# Patient Record
Sex: Male | Born: 1955 | Race: White | Hispanic: No | Marital: Married | Smoking: Never smoker
Health system: Southern US, Community
[De-identification: ages and names within clinical notes are randomized; demographics above are authoritative.]

## PROBLEM LIST (undated history)

## (undated) DIAGNOSIS — R569 Unspecified convulsions: Secondary | ICD-10-CM

## (undated) DIAGNOSIS — I2510 Atherosclerotic heart disease of native coronary artery without angina pectoris: Secondary | ICD-10-CM

## (undated) DIAGNOSIS — Z87898 Personal history of other specified conditions: Secondary | ICD-10-CM

## (undated) DIAGNOSIS — A239 Brucellosis, unspecified: Secondary | ICD-10-CM

## (undated) HISTORY — DX: Atherosclerotic heart disease of native coronary artery without angina pectoris: I25.10

## (undated) HISTORY — DX: Brucellosis, unspecified: A23.9

## (undated) HISTORY — PX: LUMBAR DISC SURGERY: SHX700

## (undated) HISTORY — DX: Personal history of other specified conditions: Z87.898

## (undated) HISTORY — DX: Unspecified convulsions: R56.9

---

## 1998-01-29 ENCOUNTER — Ambulatory Visit (HOSPITAL_COMMUNITY): Admission: RE | Admit: 1998-01-29 | Discharge: 1998-01-29 | Payer: Self-pay | Admitting: Neurosurgery

## 2001-07-28 ENCOUNTER — Observation Stay (HOSPITAL_COMMUNITY): Admission: EM | Admit: 2001-07-28 | Discharge: 2001-07-30 | Payer: Self-pay

## 2001-07-30 ENCOUNTER — Encounter: Payer: Self-pay | Admitting: *Deleted

## 2005-07-26 ENCOUNTER — Ambulatory Visit: Payer: Self-pay | Admitting: Internal Medicine

## 2005-07-31 ENCOUNTER — Encounter: Admission: RE | Admit: 2005-07-31 | Discharge: 2005-07-31 | Payer: Self-pay | Admitting: Internal Medicine

## 2007-06-12 ENCOUNTER — Encounter: Payer: Self-pay | Admitting: Internal Medicine

## 2007-06-12 ENCOUNTER — Ambulatory Visit: Payer: Self-pay | Admitting: Internal Medicine

## 2007-06-12 DIAGNOSIS — R0789 Other chest pain: Secondary | ICD-10-CM | POA: Insufficient documentation

## 2008-11-08 ENCOUNTER — Telehealth (INDEPENDENT_AMBULATORY_CARE_PROVIDER_SITE_OTHER): Payer: Self-pay | Admitting: *Deleted

## 2008-11-09 ENCOUNTER — Ambulatory Visit: Payer: Self-pay | Admitting: Family Medicine

## 2008-11-09 DIAGNOSIS — R3129 Other microscopic hematuria: Secondary | ICD-10-CM | POA: Insufficient documentation

## 2008-11-09 LAB — CONVERTED CEMR LAB
Bilirubin Urine: NEGATIVE
Ketones, urine, test strip: NEGATIVE
Protein, U semiquant: NEGATIVE
Specific Gravity, Urine: 1.025
Urobilinogen, UA: 0.2

## 2008-11-11 ENCOUNTER — Telehealth: Payer: Self-pay | Admitting: Internal Medicine

## 2008-11-12 ENCOUNTER — Ambulatory Visit: Payer: Self-pay | Admitting: Internal Medicine

## 2008-11-12 DIAGNOSIS — N410 Acute prostatitis: Secondary | ICD-10-CM | POA: Insufficient documentation

## 2008-11-14 ENCOUNTER — Ambulatory Visit: Payer: Self-pay | Admitting: Internal Medicine

## 2008-11-14 LAB — CONVERTED CEMR LAB
ALT: 27 units/L (ref 0–53)
AST: 19 units/L (ref 0–37)
Albumin: 4.2 g/dL (ref 3.5–5.2)
BUN: 20 mg/dL (ref 6–23)
Basophils Relative: 0.7 % (ref 0.0–3.0)
CO2: 32 meq/L (ref 19–32)
Chloride: 104 meq/L (ref 96–112)
Creatinine, Ser: 0.9 mg/dL (ref 0.4–1.5)
Direct LDL: 148 mg/dL
Eosinophils Relative: 3.7 % (ref 0.0–5.0)
HDL: 32.9 mg/dL — ABNORMAL LOW (ref >39.0)
Lymphocytes Relative: 33.2 % (ref 12.0–46.0)
Monocytes Relative: 5.7 % (ref 3.0–12.0)
Neutrophils Relative %: 56.7 % (ref 43.0–77.0)
RBC: 4.96 M/uL (ref 4.22–5.81)
VLDL: 28 mg/dL (ref 0–40)
WBC: 8.5 10*3/uL (ref 4.5–10.5)

## 2008-11-23 ENCOUNTER — Encounter: Payer: Self-pay | Admitting: Internal Medicine

## 2009-01-15 ENCOUNTER — Encounter: Payer: Self-pay | Admitting: Internal Medicine

## 2009-01-20 ENCOUNTER — Telehealth (INDEPENDENT_AMBULATORY_CARE_PROVIDER_SITE_OTHER): Payer: Self-pay | Admitting: *Deleted

## 2009-01-22 ENCOUNTER — Ambulatory Visit: Payer: Self-pay | Admitting: Internal Medicine

## 2009-01-22 LAB — CONVERTED CEMR LAB
Hemoglobin, Urine: NEGATIVE
Ketones, ur: NEGATIVE mg/dL
Specific Gravity, Urine: 1.01 (ref 1.000–1.030)
Total Protein, Urine: NEGATIVE mg/dL
Urine Glucose: NEGATIVE mg/dL
Urobilinogen, UA: 0.2 (ref 0.0–1.0)

## 2009-07-30 ENCOUNTER — Encounter: Payer: Self-pay | Admitting: Internal Medicine

## 2009-08-05 ENCOUNTER — Ambulatory Visit (HOSPITAL_COMMUNITY): Admission: RE | Admit: 2009-08-05 | Discharge: 2009-08-06 | Payer: Self-pay | Admitting: Neurosurgery

## 2009-09-01 ENCOUNTER — Encounter: Payer: Self-pay | Admitting: Internal Medicine

## 2009-09-03 ENCOUNTER — Ambulatory Visit: Payer: Self-pay | Admitting: Diagnostic Radiology

## 2009-09-03 ENCOUNTER — Emergency Department (HOSPITAL_BASED_OUTPATIENT_CLINIC_OR_DEPARTMENT_OTHER): Admission: EM | Admit: 2009-09-03 | Discharge: 2009-09-03 | Payer: Self-pay | Admitting: Emergency Medicine

## 2009-09-10 ENCOUNTER — Ambulatory Visit: Payer: Self-pay | Admitting: Internal Medicine

## 2009-09-17 ENCOUNTER — Encounter: Payer: Self-pay | Admitting: Internal Medicine

## 2009-10-31 ENCOUNTER — Telehealth (INDEPENDENT_AMBULATORY_CARE_PROVIDER_SITE_OTHER): Payer: Self-pay | Admitting: *Deleted

## 2009-11-19 ENCOUNTER — Encounter: Payer: Self-pay | Admitting: Internal Medicine

## 2010-01-26 ENCOUNTER — Encounter: Payer: Self-pay | Admitting: Internal Medicine

## 2010-03-20 ENCOUNTER — Encounter: Payer: Self-pay | Admitting: Internal Medicine

## 2010-07-17 ENCOUNTER — Encounter: Payer: Self-pay | Admitting: Internal Medicine

## 2010-10-13 NOTE — Miscellaneous (Signed)
Summary: [facility]  [facility]   Imported By: Sherian Rein 09/30/2009 11:06:13  _____________________________________________________________________  External Attachment:    Type:   Image     Comment:   External Document

## 2010-10-13 NOTE — Letter (Signed)
Summary: [facility]  [facility]   Imported By: Sherian Rein 08/12/2010 08:44:56  _____________________________________________________________________  External Attachment:    Type:   Image     Comment:   External Document

## 2010-10-13 NOTE — Letter (Signed)
Summary: [facility]  [facility]   Imported By: Sherian Rein 10/02/2009 15:16:16  _____________________________________________________________________  External Attachment:    Type:   Image     Comment:   External Document

## 2010-10-13 NOTE — Progress Notes (Signed)
Summary: Records request from [facility]  Request for records received from [facility].Request forwarded to [facility]. Wilder Glade  October 31, 2009 12:45 PM

## 2010-10-13 NOTE — Miscellaneous (Signed)
Summary: PT Re-Eval/[facility]  PT Re-Eval/[facility]   Imported By: Sherian Rein 01/28/2010 09:59:11  _____________________________________________________________________  External Attachment:    Type:   Image     Comment:   External Document

## 2010-10-13 NOTE — Miscellaneous (Signed)
Summary: [facility]  [facility]   Imported By: Sherian Rein 12/03/2009 08:27:58  _____________________________________________________________________  External Attachment:    Type:   Image     Comment:   External Document

## 2010-10-13 NOTE — Letter (Signed)
Summary: [facility]  [facility]   Imported By: Lennie Odor 04/02/2010 09:48:12  _____________________________________________________________________  External Attachment:    Type:   Image     Comment:   External Document

## 2010-12-16 LAB — CBC
HCT: 43.1 % (ref 39.0–52.0)
MCHC: 34.4 g/dL (ref 30.0–36.0)
MCV: 87.2 fL (ref 78.0–100.0)
RBC: 4.94 MIL/uL (ref 4.22–5.81)

## 2011-01-29 NOTE — Discharge Summary (Signed)
[place]  Patient:    KARLTON, MAYA Visit Number: [id number] MRN: [id number]          Service Type: MED Location: [phone] Attending Physician:  Dorena Cookey Dictated by:   Cornell Barman, P.A. Admit Date:  07/28/2001 Discharge Date: 07/30/2001   CC:         Rosalyn Gess. Norins, M.D. [facility]   Discharge Summary  DISCHARGE DIAGNOSIS:  Chest pain, myocardial infarction ruled out.  BRIEF ADMISSION HISTORY:  Mr. Teed is a 55 year old who presented with chest pain x 3 days.  PAST MEDICAL HISTORY:  Mediterranean fever.  HOSPITAL COURSE: CHEST PAIN:  The patient ruled out for a myocardial infarction.  The patient underwent an adenosine Cardiolite that was negative for ischemia and he was discharged.  MEDICATIONS AT DISCHARGE: 1. Enteric-coated aspirin 325 mg daily. 2. Nitroglycerin as needed.  FOLLOW-UP:  He was instructed to follow up with Dr. Debby Bud in a week. Dictated by:   Cornell Barman, P.A. Attending Physician:  Dorena Cookey DD:  08/16/01 TD:  08/16/01 Job: [id number]

## 2011-01-29 NOTE — H&P (Signed)
[place]. [facility]  Patient:    ALEPH, NICKSON Visit Number: [id number] MRN: [id number]          Service Type: MED Location: [phone] Attending Physician:  Dorena Cookey Dictated by:   Claretta Fraise, M.D. [facility] Admit Date:  07/28/2001   CC:         Rosalyn Gess. Norins, M.D. [facility]   History and Physical  This is a 55 year old [place] gentleman whose primary care physician is Dr. Illene Regulus with [facility] [place] office.  CHIEF COMPLAINT:  Chest pain x 3 days.  HISTORY OF PRESENT ILLNESS:  This 55 year old gentleman with no previous cardiac history comes to the ER with a three day history of chest pain.  The patient states he was in his usual state of health and was at work when he started experiencing some left-sided upper chest pain that radiated into his left arm with numbness into his left finger.  Pain is described as pressure-like, he says "like somebody pressing chest".  Pain has been constant since it came on three days ago, although it varies in intensity, but it never lets up and has always been a pressure-type.  Last night, he experienced 13 out of 10 severity in pain when he was just sitting on the couch watching T.V. That was accompanied with shortness of breath, diaphoresis and some sense of nausea.  The patient had tried ibuprofen over-the-counter two tablets without any relief and we did that again four hours later without any relief.  He subsequently took an aspirin this evening at about 6 p.m. for severity of 8/10 pain which brought the severity down to 6/10.  The patient apparently had gone to the clinic this afternoon at about 4 p.m. and was told to present himself to the emergency room.  The patient denies prior chest pain history up until three days ago, he denies any orthopnea, no lower extremity edema, although he felt a little bit of swelling in his left hand last night where he felt his ring was  tight.  In regards to his cardiac risk factors, the strongest being family history of a brother who had an MI at the age of 5 who subsequently died in a motor vehicle accident.  Father at the age of 82, he has male paternal first cousin who died of an MI at age 9.  He does not have a history of hypertension, no history of diabetes mellitus, possibly has a history of hyperlipidemia for which he has never been on medications.  Apparently, he had seen a homeopath doctor in [place] who had just made some dietary recommendations.  He smokes 1-2 cigarettes, at most maybe 3-4 cigarettes per day, sometimes he may not and has been doing that for the past 15-20 years.  He does not have any scheduled regular exercise but he has a very strenuous physical type of work.  In the emergency room, the patient was given sublingual nitroglycerin - ______ x 3 with subsequent resolution of his symptoms.  He was also given three baby aspirins, although patient has already had aspirin prior to him coming to the ER.  The patient also received Lopressor 5 mg q.15 minutes x 3. The patient is currently pain-free.  ALLERGIES:  No known drug allergies.  CURRENT MEDICATIONS:  None, including vitamins or herbal medicines.  PAST MEDICAL HISTORY:  He has a history of Mediterranean fever, the last time it acted up was 5-6 years ago and  they believe it was related to a cat allergy.  They subsequently moved from that home and got rid of the animal and he has not had any further problems.  His symptoms would consist of sore throat, fever, and generalized myalgia and he had pretty extensive workup looking for the cause of his symptoms.  In the past, colchicine helped.  Also significant, approximately a month ago, he had presented to Dr. Debby Bud with a history of syncopal episode for which he had a CT scan of the head and I am presuming that was negative.  He was also placed on an, what sounds like an event monitor,  since he was still waring that as he came into the ER.  He did have a cholesterol panel done approximately three weeks ago, which he has not heard back on the results, although he has been anxious about the results.  Questionable history of hyperlipidemia.  PAST SURGICAL HISTORY:  Negative.  SOCIAL HISTORY:  He has been married for the past 14 years.  That is his first and only marriage.  His is of [place] decent and he has been in the [place] for the past six years.  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  He tries to eat healthy, although he says not all the time.  Denies any alcohol use, rather very rare, the last one was six months ago where he had a beer.  Tobacco history is as mentioned in the HPI.  FAMILY HISTORY:  Coronary artery disease in the brother at the age of 41 who subsequently died in a motor vehicle accident at the age of 54.  Father had a heart attack and died at 30.  Male maternal first cousin who died of an MI at the age of 28.  Diabetes mellitus, three brothers with diabetes age 41 the brother who died in the MVA, he couldnt tell me the other age.  Question of whether there was diabetes in the grandparents, none in his mother, but he does not know about his father.  Hypertension, three of his sisters and three of his brothers has had hypertension.  There is a total of six brothers and four sisters.  No CVA history.  Maternal male first cousins with cancer, two with breast cancer and one supposedly with uterine cancer, cancer of some sort, no colon or prostate cancer.  REVIEW OF SYSTEMS:  As per HPI, otherwise denies any fever, chills, weight loss or weight gain.  Denies any cough or persistence of cough, denies any abdominal pain, denies any heartburn, melena, hematochezia.  GU:  Denies any frequency of urination, hematuria.  Denies troubles stopping or  starting his flow or nocturia.  Aside from the paresthesia that he had with the chest pain,  he denies any other neurological symptoms.  PHYSICAL EXAMINATION:  On physical exam, he is pain-free currently.  VITAL SIGNS:  Blood pressure initially was 127/83, heart rate of 79, now after the Lopressor it is running around 63, respiratory rate 17.  Oxygen saturation on room air was 96%.  HEENT:  Right EAC showed cerumen, the left TM was slightly retracted, light reflex was present, might be erythematous, but not tremendously so.  The rest was unremarkable.  Carotid pulses 2+ bilaterally, no bruits.  LUNGS:  Lungs are clear to auscultation bilaterally without any crackles or wheezes.  HEART:  Heart is  regular rate and rhythm without any murmurs.  Rate of pulses on his cardiovascular exam were 2+ bilaterally.  ABDOMEN:  Bowel sounds were normal, soft, nontender, not distended, no hepatosplenomegaly.  LOWER EXTREMITIES:  No edema is noted.  NEUROLOGICAL:  He has no focal deficits.  His cranial nerves were intact. Muscle strength is +5/5 in all groups tested.  His DTRs and the brachial brachial radialis and knee jerks were +2/4 and he is able to plantar and dorsi flex without any problems.  CARDIOVASCULAR:  EKG showed normal sinus rhythm with no STT wave changes.  He has slight hyperpolarization.  Chest x-ray may be slight borderline cardiomegaly.  May need no pulmonary congestion type of picture.  LABORATORY DATA:  His CPK was 141, troponin of less than 0.01.  His CBC showed a white count to be normal at 9.8, H&H of 15.6 and 45.4, platelet count of 246, differential was normal.  His CMP was fairly unremarkable.  His sodium is 141, potassium of 4.4, chloride and Co2 102 and 32, BUN and creatinine 14 and 0.9, glucose of 90.  His LFTs were fairly unremarkable except for mild elevation in his ALT at 44 upper limit of normal being 40.  ASSESSMENT AND PLAN:  Chest pain.  There is some  atypical nature to it, the fact that it has been constant, however, the type of pain with radiation, etc., sounds typical.  He does have a strong family history and given his age and gender and question of hyperlipidemia.  Will go ahead and admit him to 24 hour observation and place him on telemetry.  Will go ahead and do the serial CPKs, start him on heparin protocol, continue him on the beta blocker for now and also start him on statin.  Will do sublingual nitroglycerin as needed since he is pain-free right now.  I have already spoken and discussed the case with Dr. Myrtis Ser about getting him on the schedule for Cardiolite tomorrow for them to see him and reassess his cardiac function.  Since there is no emergent need for echocardiogram and it is the weekend, will hold on doing the echo since we will get some information from the Cardiolite, Dr. Myrtis Ser and by his colleagues.  Will follow up with the patient in the morning.  There certainly is not any emergent need for them to see him tonight.  Will also go ahead and do a fasting cholesterol panel in the morning. Dictated by:   Claretta Fraise, M.D. [facility] Attending Physician:  Dorena Cookey DD:  07/28/01 TD:  07/28/01 Job: [id number]

## 2011-12-02 ENCOUNTER — Ambulatory Visit (INDEPENDENT_AMBULATORY_CARE_PROVIDER_SITE_OTHER): Payer: BC Managed Care – PPO | Admitting: Internal Medicine

## 2011-12-02 ENCOUNTER — Encounter: Payer: Self-pay | Admitting: Gastroenterology

## 2011-12-02 ENCOUNTER — Encounter: Payer: Self-pay | Admitting: Internal Medicine

## 2011-12-02 ENCOUNTER — Other Ambulatory Visit (INDEPENDENT_AMBULATORY_CARE_PROVIDER_SITE_OTHER): Payer: BC Managed Care – PPO

## 2011-12-02 VITALS — BP 100/76 | HR 87 | Temp 97.3°F | Resp 16 | Wt 175.5 lb

## 2011-12-02 DIAGNOSIS — IMO0001 Reserved for inherently not codable concepts without codable children: Secondary | ICD-10-CM | POA: Insufficient documentation

## 2011-12-02 DIAGNOSIS — R5381 Other malaise: Secondary | ICD-10-CM

## 2011-12-02 DIAGNOSIS — R1110 Vomiting, unspecified: Secondary | ICD-10-CM

## 2011-12-02 DIAGNOSIS — E1169 Type 2 diabetes mellitus with other specified complication: Secondary | ICD-10-CM | POA: Insufficient documentation

## 2011-12-02 DIAGNOSIS — E7800 Pure hypercholesterolemia, unspecified: Secondary | ICD-10-CM

## 2011-12-02 DIAGNOSIS — R5383 Other fatigue: Secondary | ICD-10-CM

## 2011-12-02 DIAGNOSIS — E785 Hyperlipidemia, unspecified: Secondary | ICD-10-CM | POA: Insufficient documentation

## 2011-12-02 LAB — HEPATIC FUNCTION PANEL
Alkaline Phosphatase: 52 U/L (ref 39–117)
Bilirubin, Direct: 0 mg/dL (ref 0.0–0.3)

## 2011-12-02 LAB — CBC WITH DIFFERENTIAL/PLATELET
Basophils Absolute: 0.1 10*3/uL (ref 0.0–0.1)
Eosinophils Absolute: 0.2 10*3/uL (ref 0.0–0.7)
Eosinophils Relative: 1.8 % (ref 0.0–5.0)
Lymphs Abs: 2.3 10*3/uL (ref 0.7–4.0)
MCHC: 34.4 g/dL (ref 30.0–36.0)
MCV: 88.4 fl (ref 78.0–100.0)
Monocytes Absolute: 0.6 10*3/uL (ref 0.1–1.0)
Neutrophils Relative %: 64.3 % (ref 43.0–77.0)
Platelets: 213 10*3/uL (ref 150.0–400.0)
RDW: 13.5 % (ref 11.5–14.6)
WBC: 8.9 10*3/uL (ref 4.5–10.5)

## 2011-12-02 LAB — COMPREHENSIVE METABOLIC PANEL
BUN: 18 mg/dL (ref 6–23)
CO2: 30 mEq/L (ref 19–32)
Calcium: 9.7 mg/dL (ref 8.4–10.5)
Chloride: 96 mEq/L (ref 96–112)
Creatinine, Ser: 0.7 mg/dL (ref 0.4–1.5)
GFR: 117.33 mL/min (ref >60.00)
Glucose, Bld: 108 mg/dL — ABNORMAL HIGH (ref 70–99)
Total Bilirubin: 0.5 mg/dL (ref 0.3–1.2)

## 2011-12-02 LAB — LIPID PANEL
Cholesterol: 215 mg/dL — ABNORMAL HIGH (ref 0–200)
Triglycerides: 219 mg/dL — ABNORMAL HIGH (ref 0.0–149.0)

## 2011-12-02 LAB — LDL CHOLESTEROL, DIRECT: Direct LDL: 148.4 mg/dL

## 2011-12-02 LAB — TSH: TSH: 2.35 u[IU]/mL (ref 0.35–5.50)

## 2011-12-02 MED ORDER — ESOMEPRAZOLE MAGNESIUM 40 MG PO CPDR: 40.0000 mg | DELAYED_RELEASE_CAPSULE | Freq: Every day | ORAL | Status: AC

## 2011-12-02 NOTE — Progress Notes (Signed)
Subjective:    Patient ID: Frank Matthews, male    DOB: August 06, 1956, 56 y.o.   MRN: [id number]  HPI Frank Matthews presents with a complaint of great fatigue and low energy that can last up to 2 -3 hours before he is able to do his usual activities but he is very limited able to only work a couple of hours. He has a history of back problems and has had surgery x 1. He is having recurrent pain and redo surgery has been proposed by Dr. Jeral Matthews but for now he is toughing it out. 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 He is taking long acting gabapentin 600 mg.   He is having discomfort in the epigastrium like a walnut that is stuck. He has had spontaneous vomiting approximately three times a week w/o nausea. He rarely takes meloxicam.  Past Medical History  Diagnosis Date  . Mediterranean fever   . H/O idiopathic seizure    Past Surgical History  Procedure Date  . Lumbar disc surgery     L5-S1   Family History  Problem Relation Age of Onset  . Other Father 32    deceased, accidental death  . Other Mother 87    natural causes  . Colon cancer Neg Hx   . Prostate cancer Neg Hx   . Coronary artery disease Neg Hx   . Heart attack Neg Hx   . Diabetes Brother    History   Social History  . Marital Status: Married    Spouse Name: N/A    Number of Children: N/A  . Years of Education: N/A   Occupational History  . restauranteur    Social History Main Topics  . Smoking status: Never Smoker   . Smokeless tobacco: Not on file  . Alcohol Use: Not on file  . Drug Use: Not on file  . Sexually Active: Not on file   Other Topics Concern  . 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     Objective:   Physical Exam Filed Vitals:   12/02/11 1118  BP: 100/76  Pulse: 87  Temp: 97.3 F (36.3 C)  Resp: 16   Wt Readings from Last 3 Encounters:  12/02/11 175 lb 8 oz (79.606 kg)  09/10/09 175 lb (79.379 kg)  11/12/08 188 lb (85.276 kg)   Gen'l - WNWD man in no distress HEENT- [place]/AT, normal Neck - supple, no thyromegaly Nodes - cervical, axillary area negative Cor- 2+ radail pulse, RRR PUlm - normal respirations ABd - BS+, no guarding, rebound, no masses, no tenderness to palpation. Ext- no deformity, MAE Neuro - A&O x 3, CN II-XII are normal, DTR's 2+ and equal including the left leg, motor strength - able to stand w/o assist, nl ambulation.  Lab Results  Component Value Date   WBC 8.9 12/02/2011   HGB 15.2 12/02/2011   HCT 44.2 12/02/2011   PLT 213.0 12/02/2011   GLUCOSE 108* 12/02/2011   CHOL 215* 12/02/2011   TRIG 219.0* 12/02/2011   HDL 43.10 12/02/2011   LDLDIRECT 148.4 12/02/2011        ALT 25 12/02/2011   AST 19 12/02/2011  NA 135 12/02/2011   K 4.4 12/02/2011   CL 96 12/02/2011   CREATININE 0.7 12/02/2011   BUN 18 12/02/2011   CO2 30 12/02/2011   TSH 2.35 12/02/2011   PSA 0.15 01/22/2009          Assessment & Plan:

## 2011-12-02 NOTE — Patient Instructions (Signed)
Weakness and fatigue - normal physical exam. Will check full set of labs looking for any underlying metabolic or medical problem (see list).  GI- spontaneous regurgitation without nausea raises my concern for a gastric outlet obstruction: may be due to acid damage or perhaps a mechanical blockage. Plan - Nexium 40 mg once a day in the AM to control acid           Referral to Dr. Wendall Papa for GI evaluation and possible upper endoscopy.  Full lab report will be sent to you.

## 2011-12-04 DIAGNOSIS — R5381 Other malaise: Secondary | ICD-10-CM | POA: Insufficient documentation

## 2011-12-04 DIAGNOSIS — R5383 Other fatigue: Secondary | ICD-10-CM | POA: Insufficient documentation

## 2011-12-04 NOTE — Assessment & Plan Note (Signed)
Symptoms of spontaneous emesis w/o nausea, w/o dysphagia, w/o dyspepsia. No change in bowel habit. No weight loss. Concern for gastric outlet obstruction.  Plan - refer to Dr. Christella Hartigan for GI evaluation.           Start PPI therapy

## 2011-12-04 NOTE — Assessment & Plan Note (Signed)
Patient with AM fatigue that makes it hard for him to do his usual activities. This is in addition to his back and leg pain which is limiting his work hours. He has had no signs of infection, denies depression and has no metabolic ailments. Labs returned as normal.  Plan - observation: watch for fevers, weight loss, sweats           For persistent symptoms will consider viral titres, further evaluation of psycho-social issues.

## 2011-12-05 ENCOUNTER — Encounter: Payer: Self-pay | Admitting: Internal Medicine

## 2011-12-27 ENCOUNTER — Encounter: Payer: Self-pay | Admitting: Gastroenterology

## 2011-12-27 ENCOUNTER — Ambulatory Visit (INDEPENDENT_AMBULATORY_CARE_PROVIDER_SITE_OTHER): Payer: BC Managed Care – PPO | Admitting: Gastroenterology

## 2011-12-27 VITALS — BP 120/80 | HR 72 | Ht 66.0 in | Wt 177.4 lb

## 2011-12-27 DIAGNOSIS — R1013 Epigastric pain: Secondary | ICD-10-CM

## 2011-12-27 NOTE — Patient Instructions (Addendum)
Continue doing your best to avoid NSAID type medicines (meloxicam, alleve, naproxen, advil). Tylenol is safe for your stomach. You should change the way you are taking your antiacid medicine (nexium) so that you are taking it 20-30 minutes prior to a decent meal as that is the way the pill is designed to work most effectively. You will be set up for an upper endoscopy with propofol in [facility] given daily hydrocodone.

## 2011-12-27 NOTE — Progress Notes (Signed)
HPI: This is a  Very pleasant 56 yo man  5-6 months knot like feeling in epigastrium, lately fairly constant.  Usually after eating it is worse.  STable weight.  Sometimes he has severe nausea.  Never had this before.  He had back surgery recently and on a lot of pain meds; this was two years ago.  Takes a lot of alleve, advil.  Also takes mobic daily.  alleve is qd, advil more often.  Takes 2 to 3 hydrocodone a day just about every day.  Started nexium about 2-3 weeks ago, he thinks it made a slight improvement.  Takes it in AM, eats 1-2 hours later.    Review of systems: Pertinent positive and negative review of systems were noted in the above HPI section. Complete review of systems was performed and was otherwise normal.    Past Medical History  Diagnosis Date  . Mediterranean fever   . H/O idiopathic seizure     Past Surgical History  Procedure Date  . Lumbar disc surgery     L5-S1    Current Outpatient Prescriptions  Medication Sig Dispense Refill  . esomeprazole (NEXIUM) 40 MG capsule Take 1 capsule (40 mg total) by mouth daily.  30 capsule  3  . gabapentin (NEURONTIN) 600 MG tablet Take 600 mg by mouth daily. Takes New: [place] per patient. Neuropathy      . HYDROCODONE-ACETAMINOPHEN PO Take by mouth 2 (two) times daily as needed.      . meloxicam (MOBIC) 15 MG tablet Take 15 mg by mouth daily as needed.      . naproxen sodium (ANAPROX) 220 MG tablet Take 220 mg by mouth as needed.        Allergies as of 12/27/2011  . (No Known Allergies)    Family History  Problem Relation Age of Onset  . Other Father 57    deceased, accidental death  . Other Mother 44    natural causes  . Colon cancer Neg Hx   . Prostate cancer Neg Hx   . Coronary artery disease Neg Hx   . Heart attack Neg Hx   . Diabetes Brother   . Breast cancer Sister     History   Social History  . Marital Status: Married    Spouse Name: N/A    Number of Children: 1  . Years of Education: N/A     Occupational History  . restauranteur    Social History Main Topics  . Smoking status: Never Smoker   . Smokeless tobacco: Never Used  . Alcohol Use: No  . Drug Use: No  . Sexually Active: Not on file   Other Topics Concern  . 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       Physical Exam: BP 120/80  Pulse 72  Ht 5\' 6"  (1.676 m)  Wt 177 lb 6.4 oz (80.468 kg)  BMI 28.63 kg/m2 Constitutional: generally well-appearing Psychiatric: alert and oriented x3 Eyes: extraocular movements intact Mouth: oral pharynx moist, no lesions Neck: supple no lymphadenopathy Cardiovascular: heart regular rate and rhythm Lungs: clear to auscultation bilaterally Abdomen: soft, nontender, nondistended, no obvious ascites, no peritoneal signs, normal bowel sounds Extremities: no lower extremity edema bilaterally Skin: no lesions on visible extremities    Assessment and plan: 56 y.o. male with  Epigastric pain, likely PUD from NSAID   Will plan  EGD at his soonest convenience. I did not  mention above but he did have CBC, complete metabolic profile about 2-3 weeks ago and these were both normal. His pain is likely from NSAID use. Perhaps he also has underlying H. pylori infection. Other etiologies include neoplasm but that seems unlikely. He takes daily narcotic pain medicines and would be best suited for deep sedation with propofol.

## 2012-02-02 ENCOUNTER — Encounter: Payer: BC Managed Care – PPO | Admitting: Gastroenterology

## 2012-03-01 ENCOUNTER — Telehealth: Payer: Self-pay

## 2012-03-01 ENCOUNTER — Encounter: Payer: Self-pay | Admitting: Gastroenterology

## 2012-03-01 ENCOUNTER — Ambulatory Visit (AMBULATORY_SURGERY_CENTER): Payer: BC Managed Care – PPO | Admitting: Gastroenterology

## 2012-03-01 VITALS — BP 164/99 | HR 79 | Temp 97.1°F | Resp 15 | Ht 66.0 in | Wt 177.0 lb

## 2012-03-01 DIAGNOSIS — K2970 Gastritis, unspecified, without bleeding: Secondary | ICD-10-CM

## 2012-03-01 DIAGNOSIS — R1013 Epigastric pain: Secondary | ICD-10-CM

## 2012-03-01 DIAGNOSIS — A048 Other specified bacterial intestinal infections: Secondary | ICD-10-CM

## 2012-03-01 DIAGNOSIS — K2990 Gastroduodenitis, unspecified, without bleeding: Secondary | ICD-10-CM

## 2012-03-01 MED ORDER — SODIUM CHLORIDE 0.9 % IV SOLN: 500.0000 mL | INTRAVENOUS | Status: DC

## 2012-03-01 NOTE — Progress Notes (Signed)
Patient did not experience any of the following events: a burn prior to discharge; a fall within the facility; wrong site/side/patient/procedure/implant event; or a hospital transfer or hospital admission upon discharge from the facility. (G8907) Patient did not have preoperative order for IV antibiotic SSI prophylaxis. (G8918)  

## 2012-03-01 NOTE — Op Note (Signed)
[facility] [address] [place], [place]  [address]  ENDOSCOPY PROCEDURE REPORT  PATIENT:  Frank, Matthews  MR#:  [id number] BIRTHDATE:  [birth year], 55 yrs. old  GENDER:  male ENDOSCOPIST:  Rachael Fee, MD Referred by:  Rosalyn Gess. Norins, M.D. PROCEDURE DATE:  03/01/2012 PROCEDURE:  EGD with biopsy, [id number] ASA CLASS:  Class II INDICATIONS:  dyspepsia MEDICATIONS:   MAC sedation, administered by CRNA, propofol (Diprivan) 150 mcg IV TOPICAL ANESTHETIC:  Cetacaine Spray  DESCRIPTION OF PROCEDURE:   After the risks benefits and alternatives of the procedure were thoroughly explained, informed consent was obtained.  The LB [id number]-[id number] endoscope was introduced through the mouth and advanced to the second portion of the duodenum, without limitations.  The instrument was slowly withdrawn as the mucosa was fully examined. <<PROCEDUREIMAGES>> There was mild, non-specific pangastritis. Biopsies taken and sent to pathology (jar 1) (see image2).  Otherwise the examination was normal (see image1, image3, image4, and image5).    Retroflexed views revealed no abnormalities.    The scope was then withdrawn from the patient and the procedure completed. COMPLICATIONS:  None  ENDOSCOPIC IMPRESSION: 1) Mild gastritis, biopsies taken to check for H. pylori 2) Otherwise normal examination  RECOMMENDATIONS: Continue to avoid NSAID type pain medicines and stay on Nexium 20-30 min prior to breakfast meal daily.  If biopsies show H. pylori, you will be started on the appropriate abx.  ______________________________ Rachael Fee, MD  n. eSIGNED:   Rachael Fee at 03/01/2012 10:04 AM  Gordy Levan, [phone]

## 2012-03-01 NOTE — Telephone Encounter (Signed)
Message copied by Donata Duff on Wed Mar 01, 2012 10:23 AM ------      Message from: Rob Bunting P      Created: Wed Mar 01, 2012 10:16 AM       He needs routine screening colonosopy, with propofol in [facility], can you call him to help set this up. (just did EGD in [facility]) thanks

## 2012-03-01 NOTE — Patient Instructions (Addendum)
YOU HAD AN ENDOSCOPIC PROCEDURE TODAY AT THE [facility]: Refer to the procedure report that was given to you for any specific questions about what was found during the examination.  If the procedure report does not answer your questions, please call your gastroenterologist to clarify.  If you requested that your care partner not be given the details of your procedure findings, then the procedure report has been included in a sealed envelope for you to review at your convenience later.  YOU SHOULD EXPECT: Some feelings of bloating in the abdomen. Passage of more gas than usual.  Walking can help get rid of the air that was put into your GI tract during the procedure and reduce the bloating. If you had a lower endoscopy (such as a colonoscopy or flexible sigmoidoscopy) you may notice spotting of blood in your stool or on the toilet paper. If you underwent a bowel prep for your procedure, then you may not have a normal bowel movement for a few days.  DIET: Your first meal following the procedure should be a light meal and then it is ok to progress to your normal diet.  A half-sandwich or bowl of soup is an example of a good first meal.  Heavy or fried foods are harder to digest and may make you feel nauseous or bloated.  Likewise meals heavy in dairy and vegetables can cause extra gas to form and this can also increase the bloating.  Drink plenty of fluids but you should avoid alcoholic beverages for 24 hours.  ACTIVITY: Your care partner should take you home directly after the procedure.  You should plan to take it easy, moving slowly for the rest of the day.  You can resume normal activity the day after the procedure however you should NOT DRIVE or use heavy machinery for 24 hours (because of the sedation medicines used during the test).    SYMPTOMS TO REPORT IMMEDIATELY: A gastroenterologist can be reached at any hour.  During normal business hours, 8:30 AM to 5:00 PM Monday through Friday,  call [phone].  After hours and on weekends, please call the GI answering service at [phone] who will take a message and have the physician on call contact you.    Following upper endoscopy (EGD)  Vomiting of blood or coffee ground material  New chest pain or pain under the shoulder blades  Painful or persistently difficult swallowing  New shortness of breath  Fever of 100F or higher  Black, tarry-looking stools  FOLLOW UP: If any biopsies were taken you will be contacted by phone or by letter within the next 1-3 weeks.  Call your gastroenterologist if you have not heard about the biopsies in 3 weeks.  Our staff will call the home number listed on your records the next business day following your procedure to check on you and address any questions or concerns that you may have at that time regarding the information given to you following your procedure. This is a courtesy call and so if there is no answer at the home number and we have not heard from you through the emergency physician on call, we will assume that you have returned to your regular daily activities without incident.  SIGNATURES/CONFIDENTIALITY: You and/or your care partner have signed paperwork which will be entered into your electronic medical record.  These signatures attest to the fact that that the information above on your After Visit Summary has been reviewed and is understood.  Full   responsibility of the confidentiality of this discharge information lies with you and/or your care-partner.   Avoid NSAIDS, Resume all other medications. Information given on gastritis with discharge instructions.

## 2012-03-02 ENCOUNTER — Telehealth: Payer: Self-pay | Admitting: *Deleted

## 2012-03-02 NOTE — Telephone Encounter (Signed)
  Follow up Call-  Call back number 03/01/2012  Post procedure Call Back phone  # [phone]  Permission to leave phone message Yes     [facility]

## 2012-03-02 NOTE — Telephone Encounter (Signed)
Pt is not ready to schedule at this time.  He will call when he is ready to schedule

## 2012-03-07 ENCOUNTER — Other Ambulatory Visit: Payer: Self-pay

## 2012-03-07 MED ORDER — AMOXICILL-CLARITHRO-LANSOPRAZ PO MISC: Freq: Two times a day (BID) | ORAL | Status: DC

## 2012-03-13 ENCOUNTER — Encounter: Payer: BC Managed Care – PPO | Admitting: Gastroenterology

## 2013-07-11 ENCOUNTER — Ambulatory Visit (HOSPITAL_COMMUNITY)
Admission: RE | Admit: 2013-07-11 | Discharge: 2013-07-11 | Disposition: A | Discharge status: Home / Self Care | Payer: BC Managed Care – PPO | Source: Ambulatory Visit | Attending: Internal Medicine | Admitting: Internal Medicine

## 2013-07-11 ENCOUNTER — Ambulatory Visit (INDEPENDENT_AMBULATORY_CARE_PROVIDER_SITE_OTHER): Payer: BC Managed Care – PPO | Admitting: Internal Medicine

## 2013-07-11 ENCOUNTER — Other Ambulatory Visit (INDEPENDENT_AMBULATORY_CARE_PROVIDER_SITE_OTHER): Payer: BC Managed Care – PPO

## 2013-07-11 ENCOUNTER — Encounter: Payer: Self-pay | Admitting: Internal Medicine

## 2013-07-11 VITALS — BP 140/90 | HR 62 | Temp 98.1°F | Wt 181.0 lb

## 2013-07-11 DIAGNOSIS — R109 Unspecified abdominal pain: Secondary | ICD-10-CM

## 2013-07-11 DIAGNOSIS — R52 Pain, unspecified: Secondary | ICD-10-CM

## 2013-07-11 DIAGNOSIS — K5730 Diverticulosis of large intestine without perforation or abscess without bleeding: Secondary | ICD-10-CM | POA: Insufficient documentation

## 2013-07-11 DIAGNOSIS — R918 Other nonspecific abnormal finding of lung field: Secondary | ICD-10-CM | POA: Insufficient documentation

## 2013-07-11 DIAGNOSIS — R319 Hematuria, unspecified: Secondary | ICD-10-CM | POA: Insufficient documentation

## 2013-07-11 LAB — URINALYSIS, ROUTINE W REFLEX MICROSCOPIC
Bilirubin Urine: NEGATIVE
Ketones, ur: NEGATIVE
Urine Glucose: NEGATIVE
Urobilinogen, UA: 0.2 (ref 0.0–1.0)

## 2013-07-11 MED ORDER — KETOROLAC TROMETHAMINE 60 MG/2ML IM SOLN
60.0000 mg | Freq: Once | INTRAMUSCULAR | Status: AC
  Administered 2013-07-11: 60 mg via INTRAMUSCULAR

## 2013-07-11 MED ORDER — KETOROLAC TROMETHAMINE 10 MG PO TABS: 10.0000 mg | ORAL_TABLET | Freq: Four times a day (QID) | ORAL | Status: DC | PRN

## 2013-07-11 NOTE — Patient Instructions (Signed)
Acute flank pain right - pain does radiate to anterior and there is tenderness on exam. There is a history of nephrolithiasis. Suspect recurrent kidney stone.  Plan U/A w/ micro - if positive for blood - CT kidney stone protocol  Ketorolac 60 mg IM and then 10 mg every 6 hours as needed.  If large stone on CT will refer urgently to urology.   Kidney Stones Kidney stones (ureteral lithiasis) are deposits that form inside your kidneys. The intense pain is caused by the stone moving through the urinary tract. When the stone moves, the ureter goes into spasm around the stone. The stone is usually passed in the urine.  CAUSES   A disorder that makes certain neck glands produce too much parathyroid hormone (primary hyperparathyroidism).  A buildup of uric acid crystals.  Narrowing (stricture) of the ureter.  A kidney obstruction present at birth (congenital obstruction).  Previous surgery on the kidney or ureters.  Numerous kidney infections. SYMPTOMS   Feeling sick to your stomach (nauseous).  Throwing up (vomiting).  Blood in the urine (hematuria).  Pain that usually spreads (radiates) to the groin.  Frequency or urgency of urination. DIAGNOSIS   Taking a history and physical exam.  Blood or urine tests.  Computerized X-ray scan (CT scan).  Occasionally, an examination of the inside of the urinary bladder (cystoscopy) is performed. TREATMENT   Observation.  Increasing your fluid intake.  Surgery may be needed if you have severe pain or persistent obstruction. The size, location, and chemical composition are all important variables that will determine the proper choice of action for you. Talk to your caregiver to better understand your situation so that you will minimize the risk of injury to yourself and your kidney.  HOME CARE INSTRUCTIONS   Drink enough water and fluids to keep your urine clear or pale yellow.  Strain all urine through the provided strainer. Keep  all particulate matter and stones for your caregiver to see. The stone causing the pain may be as small as a grain of salt. It is very important to use the strainer each and every time you pass your urine. The collection of your stone will allow your caregiver to analyze it and verify that a stone has actually passed.  Only take over-the-counter or prescription medicines for pain, discomfort, or fever as directed by your caregiver.  Make a follow-up appointment with your caregiver as directed.  Get follow-up X-rays if required. The absence of pain does not always mean that the stone has passed. It may have only stopped moving. If the urine remains completely obstructed, it can cause loss of kidney function or even complete destruction of the kidney. It is your responsibility to make sure X-rays and follow-ups are completed. Ultrasounds of the kidney can show blockages and the status of the kidney. Ultrasounds are not associated with any radiation and can be performed easily in a matter of minutes. SEEK IMMEDIATE MEDICAL CARE IF:   Pain cannot be controlled with the prescribed medicine.  You have a fever.  The severity or intensity of pain increases over 18 hours and is not relieved by pain medicine.  You develop a new onset of abdominal pain.  You feel faint or pass out. MAKE SURE YOU:   Understand these instructions.  Will watch your condition.  Will get help right away if you are not doing well or get worse. Document Released: 08/30/2005 Document Revised: 11/22/2011 Document Reviewed: 12/26/2009 [facility] Patient Information 2014 [place], [place].

## 2013-07-11 NOTE — Progress Notes (Signed)
  Subjective:    Patient ID: Frank Matthews, male    DOB: 1956/06/02, 57 y.o.   MRN: [id number]  HPI Having severe pain in the right flank: severe constant pain that can ease up but does not go away. No hematuria. He has a h/o kidney stones. He has had no fever or chills.  He does report progressive DOE so that he has gone from walking 5 miles to 1/4 mile. No chest pain. In [place] last winter he had chest pain and saw a doctor and had a treadmill that was reported to be normal but the doctor said he may need an angiogram.  Past Medical History  Diagnosis Date  . Mediterranean fever   . H/O idiopathic seizure    Past Surgical History  Procedure Laterality Date  . Lumbar disc surgery      L5-S1   Family History  Problem Relation Age of Onset  . Other Father 63    deceased, accidental death  . Other Mother 70    natural causes  . Colon cancer Neg Hx   . Prostate cancer Neg Hx   . Coronary artery disease Neg Hx   . Heart attack Neg Hx   . Diabetes Brother   . Breast cancer Sister    History   Social History  . Marital Status: Married    Spouse Name: N/A    Number of Children: 1  . Years of Education: N/A   Occupational History  . restauranteur    Social History Main Topics  . Smoking status: Never Smoker   . Smokeless tobacco: Never Used  . Alcohol Use: No  . Drug Use: No  . Sexual Activity: Not on file   Other Topics Concern  . Not on file   Social History Narrative   [employer]   Married '91   1 daughter - '96   Work - Copywriter, advertising      Review of Systems System review is negative for any constitutional, cardiac, pulmonary, GI or neuro symptoms or complaints other than as described in the HPI.     Objective:   Physical Exam Filed Vitals:   07/11/13 1622  BP: 140/90  Pulse: 62  Temp: 98.1 F (36.7 C)   gen'l- WNWD man in discomfort but not acute distress HEENT- C&S clear Cor- RRR Pulm- CTAP Abd - no tenderness to  percussion over flank, very tender to palpation RLQ Neuro - A&O x 3, normal gait and station        Assessment & Plan:  Acute flank pain right - pain does radiate to anterior and there is tenderness on exam. There is a history of nephrolithiasis. Suspect recurrent kidney stone.  Plan U/A w/ micro - if positive for blood - CT kidney stone protocol  Ketorolac 60 mg IM and then 10 mg tid as needed.  If large stone on CT will refer urgently to urology.  Addendum: CT negative for stone  Plan - follow up 07/12/13

## 2013-07-12 ENCOUNTER — Ambulatory Visit (INDEPENDENT_AMBULATORY_CARE_PROVIDER_SITE_OTHER): Payer: BC Managed Care – PPO

## 2013-07-12 ENCOUNTER — Encounter: Payer: Self-pay | Admitting: Internal Medicine

## 2013-07-12 ENCOUNTER — Ambulatory Visit (INDEPENDENT_AMBULATORY_CARE_PROVIDER_SITE_OTHER): Payer: BC Managed Care – PPO | Admitting: Internal Medicine

## 2013-07-12 VITALS — BP 134/86 | HR 70 | Temp 98.3°F | Wt 180.4 lb

## 2013-07-12 DIAGNOSIS — E7800 Pure hypercholesterolemia, unspecified: Secondary | ICD-10-CM

## 2013-07-12 DIAGNOSIS — R52 Pain, unspecified: Secondary | ICD-10-CM

## 2013-07-12 DIAGNOSIS — R0789 Other chest pain: Secondary | ICD-10-CM

## 2013-07-12 DIAGNOSIS — R1011 Right upper quadrant pain: Secondary | ICD-10-CM

## 2013-07-12 DIAGNOSIS — I209 Angina pectoris, unspecified: Secondary | ICD-10-CM

## 2013-07-12 DIAGNOSIS — I208 Other forms of angina pectoris: Secondary | ICD-10-CM

## 2013-07-12 DIAGNOSIS — R109 Unspecified abdominal pain: Secondary | ICD-10-CM

## 2013-07-12 NOTE — Progress Notes (Signed)
Subjective:    Patient ID: Frank Matthews, male    DOB: 1956-03-25, 57 y.o.   MRN: [id number]  HPI Frank Matthews was seen yesterday for pain in the right flank that was severe and steady over the preceeding 5 days. He did have microscopic hematuria. CT kidney stone protocol was negative for stone or hydroureter. There was an incidental finding of a RLL 4mm pleural nodule. On a non-contrast study the solid organs did look ok.   He has continued to have pain but adequately managed with toradol. No acute change in respiratory function, no fever, no pain with urination. He has had nausea but w/o emesis.   He has had a decrease in exercise tolerance- used to be able to walk 5+ miles. 6 weeks ago after about a mile walk he had chest pressure and had to stop and rest. Recovery took about 20 minutes. In the interval he has taken short walks in the neighborhood w/o problems. Jan '13 in [place] he was walking and had the on set SOB, sudden weakness and chest pain. He saw a doctor. He had a treadmill test and he was told that it was ok but that he might need an angiogram when he got [place] to [place].   Cardiac risk: male gender, mild cholesterol elevation, positive family hx - an older brother with CAD. Not diabetic, not obese, non-smoker, non-drinker.   Past Medical History  Diagnosis Date  . Mediterranean fever   . H/O idiopathic seizure    Past Surgical History  Procedure Laterality Date  . Lumbar disc surgery      L5-S1   Family History  Problem Relation Age of Onset  . Other Father 32    deceased, accidental death  . Other Mother 14    natural causes  . Colon cancer Neg Hx   . Prostate cancer Neg Hx   . Coronary artery disease Neg Hx   . Heart attack Pos Hx - brother at 34 with MI   . Diabetes Brother   . Breast cancer Sister    History   Social History  . Marital Status: Married    Spouse Name: N/A    Number of Children: 1  . Years of Education: N/A   Occupational History  .  restauranteur    Social History Main Topics  . Smoking status: Never Smoker   . Smokeless tobacco: Never Used  . Alcohol Use: No  . Drug Use: No  . Sexual Activity: Not on file   Other Topics Concern  . Not on file   Social History Narrative   [employer] Landscape [employer]   Married '91   1 daughter - '96   Work - Copywriter, advertising   Scheduled Meds: Continuous Infusions: PRN Meds:.       Review of Systems System review is negative for any constitutional, cardiac, pulmonary, GI or neuro symptoms or complaints other than as described in the HPI.      Objective:   Physical Exam Filed Vitals:   07/12/13 1540  BP: 134/86  Pulse: 70  Temp: 98.3 F (36.8 C)   Wt Readings from Last 3 Encounters:  07/12/13 180 lb 6.4 oz (81.829 kg)  07/11/13 181 lb (82.101 kg)  03/01/12 177 lb (80.287 kg)    BP Readings from Last 3 Encounters:  07/12/13 134/86  07/11/13 140/90  03/01/12 164/99   Gen'l - WNWD man in no distress HEENT- C&S clear Cor - 2+ radial, RRR Pulm - normal Abd -  BS+, tender to palpation RUQ and flank. Neuro - normal       Assessment & Plan:

## 2013-07-12 NOTE — Patient Instructions (Signed)
1. Pain in the right side - no stone. You are tender in the area of the gallbladder. Also concerned about possible blood clot given your multiple drives to [place]. Plan Gallbladder ultrasound  Lab to look for gallbladder disease  D-Dimer - a blood test to look for possible blood clot.  2. On CT yesterday there was a small nodule in the right lung with recommendation for follow up. Plan  Ct angiogram of the chest to both look at the nodule and to rule out blood clot. If the D-dimer test is negative will convert to a plain Ct chest.  3. Cardiac  - an older problem of having exertional chest pain. It is of note that a qualified cardiologist after doing a stress test and ultrasound of the heart said you "might want to get a angio when you get home." You have had a recurrent episode of exertional chest pain in the setting of several cardiac risk factors. We need to be sure your heart is ok. Plan Referral to one of our cardiologists who do angiograms - you will be notified  Lab- cholesterol etc.   Take aspirin 325 mg once a day while we get this sorted out.  For any recurrent episode of shortness of breath and chest heaviness, like in [place], go to [facility] and say you are having angina.

## 2013-07-13 ENCOUNTER — Emergency Department (HOSPITAL_COMMUNITY)
Admission: EM | Admit: 2013-07-13 | Discharge: 2013-07-13 | Disposition: A | Discharge status: Home / Self Care | Payer: BC Managed Care – PPO | Attending: Emergency Medicine | Admitting: Emergency Medicine

## 2013-07-13 ENCOUNTER — Other Ambulatory Visit: Payer: Self-pay | Admitting: Internal Medicine

## 2013-07-13 ENCOUNTER — Encounter (HOSPITAL_COMMUNITY): Payer: Self-pay | Admitting: Emergency Medicine

## 2013-07-13 ENCOUNTER — Emergency Department (HOSPITAL_COMMUNITY): Payer: BC Managed Care – PPO

## 2013-07-13 DIAGNOSIS — Z7982 Long term (current) use of aspirin: Secondary | ICD-10-CM | POA: Insufficient documentation

## 2013-07-13 DIAGNOSIS — Z79899 Other long term (current) drug therapy: Secondary | ICD-10-CM | POA: Insufficient documentation

## 2013-07-13 DIAGNOSIS — R911 Solitary pulmonary nodule: Secondary | ICD-10-CM

## 2013-07-13 DIAGNOSIS — Z87442 Personal history of urinary calculi: Secondary | ICD-10-CM | POA: Insufficient documentation

## 2013-07-13 DIAGNOSIS — R110 Nausea: Secondary | ICD-10-CM | POA: Insufficient documentation

## 2013-07-13 DIAGNOSIS — R109 Unspecified abdominal pain: Secondary | ICD-10-CM | POA: Insufficient documentation

## 2013-07-13 DIAGNOSIS — Z8619 Personal history of other infectious and parasitic diseases: Secondary | ICD-10-CM | POA: Insufficient documentation

## 2013-07-13 DIAGNOSIS — Z8669 Personal history of other diseases of the nervous system and sense organs: Secondary | ICD-10-CM | POA: Insufficient documentation

## 2013-07-13 LAB — CBC WITH DIFFERENTIAL/PLATELET
Eosinophils Relative: 3 % (ref 0–5)
HCT: 43.6 % (ref 39.0–52.0)
Lymphocytes Relative: 23 % (ref 12–46)
Lymphs Abs: 2.4 10*3/uL (ref 0.7–4.0)
MCV: 86.3 fL (ref 78.0–100.0)
Monocytes Absolute: 0.6 10*3/uL (ref 0.1–1.0)
Neutrophils Relative %: 68 % (ref 43–77)
Platelets: 255 10*3/uL (ref 150–400)
RBC: 5.05 MIL/uL (ref 4.22–5.81)
WBC: 10.2 10*3/uL (ref 4.0–10.5)

## 2013-07-13 LAB — COMPREHENSIVE METABOLIC PANEL
ALT: 24 U/L (ref 0–53)
ALT: 23 U/L (ref 0–53)
AST: 23 U/L (ref 0–37)
Albumin: 4.5 g/dL (ref 3.5–5.2)
Alkaline Phosphatase: 54 U/L (ref 39–117)
CO2: 31 mEq/L (ref 19–32)
Calcium: 9.7 mg/dL (ref 8.4–10.5)
Chloride: 98 mEq/L (ref 96–112)
Creatinine, Ser: 0.8 mg/dL (ref 0.4–1.5)
GFR calc Af Amer: >90 mL/min (ref >90)
GFR calc non Af Amer: >90 mL/min (ref >90)
Glucose, Bld: 95 mg/dL (ref 70–99)
Glucose, Bld: 107 mg/dL — ABNORMAL HIGH (ref 70–99)
Potassium: 4.6 mEq/L (ref 3.5–5.1)
Sodium: 138 mEq/L (ref 135–145)
Total Bilirubin: 0.6 mg/dL (ref 0.3–1.2)
Total Bilirubin: 0.2 mg/dL — ABNORMAL LOW (ref 0.3–1.2)

## 2013-07-13 LAB — URINALYSIS, ROUTINE W REFLEX MICROSCOPIC
Bilirubin Urine: NEGATIVE
Hgb urine dipstick: NEGATIVE
Ketones, ur: NEGATIVE mg/dL
Protein, ur: NEGATIVE mg/dL
Specific Gravity, Urine: 1.013 (ref 1.005–1.030)
pH: 8 (ref 5.0–8.0)

## 2013-07-13 LAB — HEPATIC FUNCTION PANEL
AST: 23 U/L (ref 0–37)
Albumin: 4.5 g/dL (ref 3.5–5.2)
Alkaline Phosphatase: 48 U/L (ref 39–117)
Total Protein: 8 g/dL (ref 6.0–8.3)

## 2013-07-13 LAB — LIPID PANEL
Cholesterol: 201 mg/dL — ABNORMAL HIGH (ref 0–200)
Total CHOL/HDL Ratio: 5
Triglycerides: 228 mg/dL — ABNORMAL HIGH (ref 0.0–149.0)
VLDL: 45.6 mg/dL — ABNORMAL HIGH (ref 0.0–40.0)

## 2013-07-13 MED ORDER — OXYCODONE-ACETAMINOPHEN 5-325 MG PO TABS: 2.0000 | ORAL_TABLET | ORAL | Status: DC | PRN

## 2013-07-13 MED ORDER — SODIUM CHLORIDE 0.9 % IV BOLUS (SEPSIS)
1000.0000 mL | Freq: Once | INTRAVENOUS | Status: AC
  Administered 2013-07-13: 1000 mL via INTRAVENOUS

## 2013-07-13 MED ORDER — ONDANSETRON 8 MG PO TBDP: 8.0000 mg | ORAL_TABLET | Freq: Three times a day (TID) | ORAL | Status: DC | PRN

## 2013-07-13 MED ORDER — IOHEXOL 300 MG/ML  SOLN
25.0000 mL | INTRAMUSCULAR | Status: AC
  Administered 2013-07-13: 25 mL via ORAL

## 2013-07-13 MED ORDER — DICYCLOMINE HCL 20 MG PO TABS: 20.0000 mg | ORAL_TABLET | Freq: Four times a day (QID) | ORAL | Status: DC | PRN

## 2013-07-13 MED ORDER — POLYETHYLENE GLYCOL 3350 17 G PO PACK: 17.0000 g | PACK | Freq: Every day | ORAL | Status: DC

## 2013-07-13 MED ORDER — MORPHINE SULFATE 4 MG/ML IJ SOLN
4.0000 mg | Freq: Once | INTRAMUSCULAR | Status: AC
  Administered 2013-07-13: 4 mg via INTRAVENOUS
  Filled 2013-07-13: qty 1

## 2013-07-13 MED ORDER — ONDANSETRON HCL 4 MG/2ML IJ SOLN
4.0000 mg | Freq: Once | INTRAMUSCULAR | Status: AC
  Administered 2013-07-13: 4 mg via INTRAVENOUS
  Filled 2013-07-13: qty 2

## 2013-07-13 MED ORDER — HYDROMORPHONE HCL PF 1 MG/ML IJ SOLN
1.0000 mg | Freq: Once | INTRAMUSCULAR | Status: AC
  Administered 2013-07-13: 1 mg via INTRAVENOUS
  Filled 2013-07-13: qty 1

## 2013-07-13 MED ORDER — IOHEXOL 300 MG/ML  SOLN
100.0000 mL | Freq: Once | INTRAMUSCULAR | Status: AC | PRN
  Administered 2013-07-13: 100 mL via INTRAVENOUS

## 2013-07-13 MED ORDER — DOCUSATE SODIUM 100 MG PO CAPS: 100.0000 mg | ORAL_CAPSULE | Freq: Two times a day (BID) | ORAL | Status: DC

## 2013-07-13 NOTE — ED Notes (Signed)
Per Patient, patient began having right flank pain on Wednesday. Patient had a CT abd and UA at PCP office that was negative for Nephrolithiasis. Pt was prescribed Toradol for pain, and patient report that it is not helping pain. Patient is reporting nausea but no emesis.

## 2013-07-13 NOTE — ED Notes (Signed)
Pt O2 saturation 87% on RA after pain medication. Pt placed on 2L via [place]. Saturation now 95%

## 2013-07-13 NOTE — ED Provider Notes (Signed)
CSN: [id number]     Arrival date & time 07/13/13  0019 History   First MD Initiated Contact with Patient 07/13/13 0103     Chief Complaint  Patient presents with  . Flank Pain  . Abdominal Pain   (Consider location/radiation/quality/duration/timing/severity/associated sxs/prior Treatment) HPI 57 year old male presents to emergency room with complaint of right abdominal and flank pain, getting on Wednesday.  He was seen by his primary care Dr.  Pain was similar to prior kidney stone.  He did have some small amount of blood in his urine.  CT scan was negative.  Patient was sent home on Toradol.  Patient was seen again yesterday due to persistent pain, labs ordered, but not yet resulted, and plan was for ultrasound of abdomen for cholelithiasis, and CT anterior chest given frequent trips to [place].  Patient reports initially the Toradol was helping, however, now he is having no relief from his pain.  He has had nausea, but no vomiting.  No fevers no chills.  No prior abdominal surgeries.  No prior history of diverticulitis or colitis.  Past Medical History  Diagnosis Date  . Mediterranean fever   . H/O idiopathic seizure    Past Surgical History  Procedure Laterality Date  . Lumbar disc surgery      L5-S1   Family History  Problem Relation Age of Onset  . Other Father 54    deceased, accidental death  . Other Mother 49    natural causes  . Colon cancer Neg Hx   . Prostate cancer Neg Hx   . Coronary artery disease Neg Hx   . Heart attack Neg Hx   . Diabetes Brother   . Breast cancer Sister    History  Substance Use Topics  . Smoking status: Never Smoker   . Smokeless tobacco: Never Used  . Alcohol Use: No    Review of Systems  All other systems reviewed and are negative.   See History of Present Illness; otherwise all other systems are reviewed and negative  Allergies  Review of patient's allergies indicates no known allergies.  Home Medications   Current Outpatient Rx   Name  Route  Sig  Dispense  Refill  . aspirin 325 MG tablet   Oral   Take 325 mg by mouth once.         Marland Kitchen ketorolac (TORADOL) 10 MG tablet   Oral   Take 1 tablet (10 mg total) by mouth every 6 (six) hours as needed for pain.   20 tablet   0   . dicyclomine (BENTYL) 20 MG tablet   Oral   Take 1 tablet (20 mg total) by mouth every 6 (six) hours as needed (for abdominal cramping).   20 tablet   0   . docusate sodium (COLACE) 100 MG capsule   Oral   Take 1 capsule (100 mg total) by mouth every 12 (twelve) hours.   60 capsule   0   . ondansetron (ZOFRAN ODT) 8 MG disintegrating tablet   Oral   Take 1 tablet (8 mg total) by mouth every 8 (eight) hours as needed for nausea.   20 tablet   0   . oxyCODONE-acetaminophen (PERCOCET/ROXICET) 5-325 MG per tablet   Oral   Take 2 tablets by mouth every 4 (four) hours as needed for pain.   20 tablet   0   . polyethylene glycol (MIRALAX / GLYCOLAX) packet   Oral   Take 17 g by mouth daily.  14 each   0    BP 130/82  Pulse 68  Temp(Src) 98 F (36.7 C) (Oral)  Resp 14  SpO2 98% Physical Exam  Nursing note and vitals reviewed. Constitutional: He is oriented to person, place, and time. He appears well-developed and well-nourished. He appears distressed (uncomfortable appearing, lying on his right side in slight fetal position).  HENT:  Head: Normocephalic and atraumatic.  Nose: Nose normal.  Mouth/Throat: Oropharynx is clear and moist.  Eyes: Conjunctivae and EOM are normal. Pupils are equal, round, and reactive to light.  Neck: Normal range of motion. Neck supple. No JVD present. No tracheal deviation present. No thyromegaly present.  Cardiovascular: Normal rate, regular rhythm, normal heart sounds and intact distal pulses.  Exam reveals no gallop and no friction rub.   No murmur heard. Pulmonary/Chest: Effort normal and breath sounds normal. No stridor. No respiratory distress. He has no wheezes. He has no rales. He  exhibits no tenderness.  Abdominal: Soft. Bowel sounds are normal. He exhibits no distension and no mass. There is tenderness (she has tenderness in right mid to lower abdomen.  There is no pain over his gallbladder.  No Murphy's sign.  There is no peritoneal signs.  Bowel sounds are slightly hyperactive). There is no rebound and no guarding.  Musculoskeletal: Normal range of motion. He exhibits no edema and no tenderness.  Lymphadenopathy:    He has no cervical adenopathy.  Neurological: He is alert and oriented to person, place, and time. He exhibits normal muscle tone. Coordination normal.  Skin: Skin is warm and dry. No rash noted. No erythema. No pallor.  Psychiatric: He has a normal mood and affect. His behavior is normal. Judgment and thought content normal.    ED Course  Procedures (including critical care time) Labs Review Labs Reviewed  URINALYSIS, ROUTINE W REFLEX MICROSCOPIC - Abnormal; Notable for the following:    APPearance CLOUDY (*)    All other components within normal limits  COMPREHENSIVE METABOLIC PANEL - Abnormal; Notable for the following:    Glucose, Bld 107 (*)    Total Bilirubin 0.2 (*)    All other components within normal limits  CBC WITH DIFFERENTIAL  LIPASE, BLOOD  D-DIMER, QUANTITATIVE  CG4 I-STAT (LACTIC ACID)   Imaging Review Ct Abdomen Pelvis Wo Contrast  07/11/2013   CLINICAL DATA:  Right-sided abdominal pain , hematuria, history of nephrolithiasis.  EXAM: CT ABDOMEN AND PELVIS WITHOUT CONTRAST  TECHNIQUE: Multidetector CT imaging of the abdomen and pelvis was performed following the standard protocol without intravenous contrast.  COMPARISON:  None.  FINDINGS: Focal 1 cm opacity in the lingula, incompletely visualized. 4 mm subpleural nodule, lateral right lower lobe image 2/4. Calcified granuloma in hepatic segment 8. Unremarkable remainder of the liver, spleen, adrenal glands, pancreas, kidneys. Unenhanced CT was performed per clinician order. Lack  of IV contrast limits sensitivity and specificity, especially for evaluation of abdominal/pelvic solid viscera. No nephrolithiasis or hydronephrosis. Patchy aortic and iliac arterial calcifications without aneurysm. Urinary bladder is nondistended. Mild prostatic enlargement. No ascites. No free air. Stomach, small bowel, and colon are nondilated. Normal appendix. Scattered descending and sigmoid diverticula without adjacent inflammatory/ edematous change. Spondylitic changes in the thoracic spine most marked L2-3 and L5-S1. No adenopathy.  IMPRESSION: 1. Negative for mass, nephrolithiasis, or ureteral calculus. 2. Lingular and right lower lobe nodules, nonspecific. Consider elective outpatient CT chest for complete pulmonary assessment to better inform followup recommendations. 3. Descending and sigmoid diverticulosis.   Electronically Signed  By: Oley Balm M.D.   On: 07/11/2013 18:32   Ct Abdomen Pelvis W Contrast  07/13/2013   CLINICAL DATA:  Worsening right abdominal pain.  EXAM: CT ABDOMEN AND PELVIS WITH CONTRAST  TECHNIQUE: Multidetector CT imaging of the abdomen and pelvis was performed using the standard protocol following bolus administration of intravenous contrast.  CONTRAST:  OMNIPAQUE IOHEXOL 300 MG/ML  SOLN  COMPARISON:  07/11/2013  FINDINGS: BODY WALL: Unremarkable.  LOWER CHEST: Coronary artery atherosclerosis.Elevated right diaphragm with right basilar atelectasis. Pulmonary nodule in the left lower lobe, measuring 5 mm, on image 17. Followup recommendations given for pulmonary nodules on CT from 2 days prior.  ABDOMEN/PELVIS:  Liver: No focal abnormality. Granulomatous changes.  Biliary: No evidence of biliary obstruction or stone.  Pancreas: Unremarkable.  Spleen: Unremarkable.  Adrenals: Unremarkable.  Kidneys and ureters: No hydronephrosis or stone. Sub cm low-density in the interpolar kidneys are too small to characterize.  Bladder: Unremarkable.  Reproductive: Unremarkable.   Bowel: Prominent mucosal folds in the region of the duodenal major papilla is likely within normal limits. No biliary or pancreatic ductal obstruction. Colonic diverticulosis. Normal appendix.  Retroperitoneum: No mass or adenopathy.  Peritoneum: No free fluid or gas.  Vascular: No acute abnormality.  Abdominal aortic atherosclerosis  OSSEOUS: No acute abnormalities. There is degenerative disc disease, with disc narrowing most notable at L2-3 and L5-S1, causing foraminal narrowing at both levels.  IMPRESSION: 1. No acute intra-abdominal abnormality. 2. Atherosclerosis, including the coronary arteries. 3. Colonic diverticulosis.   Electronically Signed   By: Tiburcio Pea M.D.   On: 07/13/2013 05:15      MDM   1. Abdominal pain   2. Flank pain    58 year old male with ongoing right-sided abdominal pain.  Patient had labs drawn yesterday, the results are not yet available.  We'll repeat most of his labs, most likely.  I will include a d-dimer, given Dr. Alvera Novel concern for possible PE.  Patient reports only moderate improvement after morphine.  Labs are fairly unremarkable.  He does have a slight elevation in his white blood cell count from prior values.  No signs of hepatic involvement.  Reexam shows persistent pain in right mid abdomen.  Differential includes diverticulitis, colitis, appendicitis.  Do not feel cholelithiasis is as likely.  Will plan for CT abdomen pelvis with contrast as the pain is increasing and progressing since his prior CT scan.  Patient is d-dimer is negative.  I do not feel a need to CT angio chest.  As patient is still a significant pain, will give a milligram of Dilaudid  After administration of Dilaudid, patient had reaction with drop in oxygen saturations, nausea, diaphoresis, and drop in blood pressure.  He has recovered with O2 and fluid bolus.  CT scan does not show any specific findings.  Advise wife, and patient that I was still not sure what cause of his pain was,  but did not seem likely to have any infectious or inflammatory causes.  Will send home on pain and nausea medicine, Bentyl for any abdominal cramping, and stool softeners.  I do not feel it necessary for him to complete the ultrasound or CT anterior chest, but I will have the patient.  Followup with Dr. Deeann Dowse and his gastroenterologist for their recommendations      Olivia Mackie, MD 07/13/13 [phone]

## 2013-07-13 NOTE — ED Notes (Signed)
Pt BP 72/59, RN started NS Bolus.

## 2013-07-15 DIAGNOSIS — I2510 Atherosclerotic heart disease of native coronary artery without angina pectoris: Secondary | ICD-10-CM | POA: Insufficient documentation

## 2013-07-15 DIAGNOSIS — R109 Unspecified abdominal pain: Secondary | ICD-10-CM | POA: Insufficient documentation

## 2013-07-15 NOTE — Assessment & Plan Note (Signed)
Frank Matthews reports two episodes of exertional chest pressure and DOE. He has a positive family history for CAD. He has few other risk factors. He is having right flank pain that is unexplained. There is a real concern for atypical angina.  PLan ASA 325 mg daily  May continue toradol  He is scheduled to see Dr. Excell Seltzer, [facility], Tuesday, Nov 4th.  He is instructed that for any recurrent chest pressure he is to go to [facility]

## 2013-07-16 ENCOUNTER — Ambulatory Visit (INDEPENDENT_AMBULATORY_CARE_PROVIDER_SITE_OTHER)
Admission: RE | Admit: 2013-07-16 | Discharge: 2013-07-16 | Disposition: A | Discharge status: Home / Self Care | Payer: BC Managed Care – PPO | Source: Ambulatory Visit | Attending: Internal Medicine | Admitting: Internal Medicine

## 2013-07-16 DIAGNOSIS — R911 Solitary pulmonary nodule: Secondary | ICD-10-CM

## 2013-07-16 MED ORDER — IOHEXOL 300 MG/ML  SOLN
80.0000 mL | Freq: Once | INTRAMUSCULAR | Status: AC | PRN
  Administered 2013-07-16: 80 mL via INTRAVENOUS

## 2013-07-19 ENCOUNTER — Other Ambulatory Visit: Payer: Self-pay

## 2013-07-20 ENCOUNTER — Encounter: Payer: Self-pay | Admitting: Cardiovascular Disease

## 2013-07-20 ENCOUNTER — Ambulatory Visit (INDEPENDENT_AMBULATORY_CARE_PROVIDER_SITE_OTHER): Payer: BC Managed Care – PPO | Admitting: Cardiovascular Disease

## 2013-07-20 VITALS — BP 122/80 | HR 72 | Ht 66.0 in | Wt 178.0 lb

## 2013-07-20 DIAGNOSIS — I209 Angina pectoris, unspecified: Secondary | ICD-10-CM

## 2013-07-20 DIAGNOSIS — I208 Other forms of angina pectoris: Secondary | ICD-10-CM

## 2013-07-20 LAB — BASIC METABOLIC PANEL
BUN: 13 mg/dL (ref 6–23)
CO2: 35 mEq/L — ABNORMAL HIGH (ref 19–32)
Chloride: 102 mEq/L (ref 96–112)
Creatinine, Ser: 0.8 mg/dL (ref 0.4–1.5)
GFR: 111.39 mL/min (ref >60.00)
Glucose, Bld: 104 mg/dL — ABNORMAL HIGH (ref 70–99)
Potassium: 4.7 mEq/L (ref 3.5–5.1)

## 2013-07-20 LAB — PROTIME-INR
INR: 1.1 ratio — ABNORMAL HIGH (ref 0.8–1.0)
Prothrombin Time: 11.5 s (ref 10.2–12.4)

## 2013-07-20 LAB — CBC WITH DIFFERENTIAL/PLATELET
Basophils Absolute: 0 10*3/uL (ref 0.0–0.1)
Basophils Relative: 0.4 % (ref 0.0–3.0)
Eosinophils Absolute: 0.3 10*3/uL (ref 0.0–0.7)
Eosinophils Relative: 3.3 % (ref 0.0–5.0)
HCT: 43.9 % (ref 39.0–52.0)
Hemoglobin: 14.9 g/dL (ref 13.0–17.0)
Lymphs Abs: 2.2 10*3/uL (ref 0.7–4.0)
MCHC: 34 g/dL (ref 30.0–36.0)
MCV: 85.2 fl (ref 78.0–100.0)
Monocytes Absolute: 0.6 10*3/uL (ref 0.1–1.0)
Monocytes Relative: 6.7 % (ref 3.0–12.0)
Neutrophils Relative %: 64.1 % (ref 43.0–77.0)
Platelets: 245 10*3/uL (ref 150.0–400.0)

## 2013-07-20 NOTE — Patient Instructions (Signed)
Your physician has requested that you have a cardiac catheterization. Cardiac catheterization is used to diagnose and/or treat various heart conditions. Doctors may recommend this procedure for a number of different reasons. The most common reason is to evaluate chest pain. Chest pain can be a symptom of coronary artery disease (CAD), and cardiac catheterization can show whether plaque is narrowing or blocking your heart's arteries. This procedure is also used to evaluate the valves, as well as measure the blood flow and oxygen levels in different parts of your heart. For further information please visit [email or web address] Please follow instruction sheet, as given.  Your physician recommends that you have lab work today: BMP, CBC, PT/INR  Your physician recommends that you continue on your current medications as directed. Please refer to the Current Medication list given to you today.

## 2013-07-20 NOTE — Progress Notes (Signed)
HPI:  This is a very nice 57 year old gentleman presenting for initial cardiac evaluation. About one year ago the patient developed severe tightness and pain in his chest when walking in the cold weather. At that time, he was visiting family in [place]. He was evaluated by a cardiologist and underwent stress testing. He was told that he would need an angiogram when he returns to the [place].  He has continued to have exertional symptoms. He used to walk 2-3 miles several times per week. Over the last few months he has had progressive symptoms of heaviness in his chest and shortness of breath with walking 1/4 mile. This occurs at a shorter distance when he is walking up a hill. Symptoms resolve with rest. He denies lightheadedness, diaphoresis, or syncope. He's had no palpitations or leg swelling. His chest pain is nonradiating. He has had no symptoms with emotional stress or at rest. He does complain of marked generalized fatigue. He has quit doing many of the things that he enjoys because of his symptoms.  The patient is a lifelong nonsmoker. He does not drink alcohol. He is married with a daughter who attends [employer].  His family history is notable for extensive coronary artery disease. He has 2 brothers who had coronary bypass surgery in their 69s. His father had coronary bypass surgery at age 37.  Outpatient Encounter Prescriptions as of 07/20/2013  Medication Sig  . aspirin 325 MG tablet Take 325 mg by mouth daily.   . [DISCONTINUED] dicyclomine (BENTYL) 20 MG tablet Take 1 tablet (20 mg total) by mouth every 6 (six) hours as needed (for abdominal cramping).  . [DISCONTINUED] docusate sodium (COLACE) 100 MG capsule Take 1 capsule (100 mg total) by mouth every 12 (twelve) hours.  . [DISCONTINUED] ketorolac (TORADOL) 10 MG tablet Take 1 tablet (10 mg total) by mouth every 6 (six) hours as needed for pain.  . [DISCONTINUED] ondansetron (ZOFRAN ODT) 8 MG disintegrating  tablet Take 1 tablet (8 mg total) by mouth every 8 (eight) hours as needed for nausea.  . [DISCONTINUED] oxyCODONE-acetaminophen (PERCOCET/ROXICET) 5-325 MG per tablet Take 2 tablets by mouth every 4 (four) hours as needed for pain.  . [DISCONTINUED] polyethylene glycol (MIRALAX / GLYCOLAX) packet Take 17 g by mouth daily.    Review of patient's allergies indicates no known allergies.  Past Medical History  Diagnosis Date  . Mediterranean fever   . H/O idiopathic seizure     Past Surgical History  Procedure Laterality Date  . Lumbar disc surgery      L5-S1    History   Social History  . Marital Status: Married    Spouse Name: N/A    Number of Children: 1  . Years of Education: N/A   Occupational History  . restauranteur    Social History Main Topics  . Smoking status: Never Smoker   . Smokeless tobacco: Never Used  . Alcohol Use: No  . Drug Use: No  . Sexual Activity: Not on file   Other Topics Concern  . Not on file   Social History Narrative   [employer] Land[employer]   Married '91   1 daughter - '96   Work - Copywriter, advertising    Family History  Problem Relation Age of Onset  . Other Father 53    deceased, accidental death  . Other Mother 37    natural causes  . Colon cancer Neg Hx   . Prostate cancer Neg Hx   .  Coronary artery disease Neg Hx   . Heart attack Neg Hx   . Diabetes Brother   . Breast cancer Sister    ROS:  General: no fevers/chills/night sweats Eyes: no blurry vision, diplopia, or amaurosis ENT: no sore throat or hearing loss Resp: no cough, wheezing, or hemoptysis CV: no edema or palpitations GI: no nausea, vomiting, diarrhea, or constipation. Positive for a recent episode of flank and right abdominal pain evaluated by CAT scan of the abdomen and pelvis without any obvious cause of his pain identified. GU: no dysuria, frequency, or hematuria Skin: no rash Neuro: no headache, numbness, tingling, or weakness of  extremities Musculoskeletal: Positive for low back pain Heme: no bleeding, DVT, or easy bruising Endo: no polydipsia or polyuria  BP 122/80  Pulse 72  Ht 5\' 6"  (1.676 m)  Wt 178 lb (80.74 kg)  BMI 28.74 kg/m2  PHYSICAL EXAM: Pt is alert and oriented, WD, WN, in no distress. HEENT: normal Neck: JVP normal. Carotid upstrokes normal without bruits. No thyromegaly. Lungs: equal expansion, clear bilaterally CV: Apex is discrete and nondisplaced, RRR without murmur or gallop Abd: soft, NT, +BS, no bruit, no hepatosplenomegaly Back: no CVA tenderness Ext: no C/C/E        Femoral pulses 2+= without bruits        DP/PT pulses intact and = Skin: warm and dry without rash Neuro: CNII-XII intact             Strength intact = bilaterally  EKG:  Normal sinus rhythm 72 beats per minute, within normal limits.  ASSESSMENT AND PLAN: This is a 57 year old gentleman presenting for evaluation of exertional chest pain. His symptoms are highly typical of angina. He has CCS class III symptoms, slowly progressive over the last few months. He has stopped doing many of his normal activities because of fatigue and chest discomfort. He has a very strong family history of multivessel coronary artery disease. I have recommended that we proceed directly with cardiac catheterization and possible PCI. I have reviewed the risks, indications, and alternatives with the patient who understands and agrees to proceed. He understands that serious complications, including vascular injury, bleeding, myocardial infarction, stroke, emergency surgery, and death occur at very low frequency (less than 1%). Will plan a right radial approach.  Hyperlipidemia. Will start the patient on a statin drug if he is found to have coronary artery disease. His LDL was 139 mg/dL.  Tonny Bollman 07/20/2013 3:14 PM

## 2013-07-22 ENCOUNTER — Other Ambulatory Visit: Payer: Self-pay | Admitting: Cardiovascular Disease

## 2013-07-23 ENCOUNTER — Ambulatory Visit (HOSPITAL_COMMUNITY)
Admission: RE | Admit: 2013-07-23 | Discharge: 2013-07-23 | Disposition: A | Discharge status: Home / Self Care | Payer: BC Managed Care – PPO | Source: Ambulatory Visit | Attending: Cardiovascular Disease | Admitting: Cardiovascular Disease

## 2013-07-23 ENCOUNTER — Encounter (HOSPITAL_COMMUNITY)
Admission: RE | Disposition: A | Discharge status: Home / Self Care | Payer: Self-pay | Source: Ambulatory Visit | Attending: Cardiovascular Disease

## 2013-07-23 DIAGNOSIS — I2510 Atherosclerotic heart disease of native coronary artery without angina pectoris: Secondary | ICD-10-CM | POA: Insufficient documentation

## 2013-07-23 DIAGNOSIS — I209 Angina pectoris, unspecified: Secondary | ICD-10-CM | POA: Insufficient documentation

## 2013-07-23 DIAGNOSIS — Z8249 Family history of ischemic heart disease and other diseases of the circulatory system: Secondary | ICD-10-CM | POA: Insufficient documentation

## 2013-07-23 HISTORY — PX: LEFT HEART CATHETERIZATION WITH CORONARY ANGIOGRAM: SHX5451

## 2013-07-23 SURGERY — LEFT HEART CATHETERIZATION WITH CORONARY ANGIOGRAM
Anesthesia: LOCAL

## 2013-07-23 MED ORDER — ASPIRIN 81 MG PO CHEW
81.0000 mg | CHEWABLE_TABLET | ORAL | Status: AC
  Administered 2013-07-23: 81 mg via ORAL

## 2013-07-23 MED ORDER — HEPARIN (PORCINE) IN NACL 2-0.9 UNIT/ML-% IJ SOLN
INTRAMUSCULAR | Status: AC
  Filled 2013-07-23: qty 1000

## 2013-07-23 MED ORDER — ONDANSETRON HCL 4 MG/2ML IJ SOLN: 4.0000 mg | Freq: Four times a day (QID) | INTRAMUSCULAR | Status: DC | PRN

## 2013-07-23 MED ORDER — MIDAZOLAM HCL 2 MG/2ML IJ SOLN
INTRAMUSCULAR | Status: AC
  Filled 2013-07-23: qty 2

## 2013-07-23 MED ORDER — VERAPAMIL HCL 2.5 MG/ML IV SOLN
INTRAVENOUS | Status: AC
  Filled 2013-07-23: qty 2

## 2013-07-23 MED ORDER — SODIUM CHLORIDE 0.9 % IV SOLN: 250.0000 mL | INTRAVENOUS | Status: DC | PRN

## 2013-07-23 MED ORDER — ASPIRIN 81 MG PO CHEW
CHEWABLE_TABLET | ORAL | Status: AC
  Filled 2013-07-23: qty 1

## 2013-07-23 MED ORDER — SODIUM CHLORIDE 0.9 % IJ SOLN: 3.0000 mL | Freq: Two times a day (BID) | INTRAMUSCULAR | Status: DC

## 2013-07-23 MED ORDER — SODIUM CHLORIDE 0.9 % IJ SOLN: 3.0000 mL | INTRAMUSCULAR | Status: DC | PRN

## 2013-07-23 MED ORDER — HEPARIN SODIUM (PORCINE) 1000 UNIT/ML IJ SOLN
INTRAMUSCULAR | Status: AC
  Filled 2013-07-23: qty 1

## 2013-07-23 MED ORDER — METOPROLOL TARTRATE 25 MG PO TABS: 12.5000 mg | ORAL_TABLET | Freq: Two times a day (BID) | ORAL | Status: DC

## 2013-07-23 MED ORDER — FENTANYL CITRATE 0.05 MG/ML IJ SOLN
INTRAMUSCULAR | Status: AC
  Filled 2013-07-23: qty 2

## 2013-07-23 MED ORDER — LIDOCAINE HCL (PF) 1 % IJ SOLN
INTRAMUSCULAR | Status: AC
  Filled 2013-07-23: qty 30

## 2013-07-23 MED ORDER — SODIUM CHLORIDE 0.9 % IV SOLN: 1.0000 mL/kg/h | INTRAVENOUS | Status: DC

## 2013-07-23 MED ORDER — NITROGLYCERIN 0.2 MG/ML ON CALL CATH LAB
INTRAVENOUS | Status: AC
  Filled 2013-07-23: qty 1

## 2013-07-23 MED ORDER — ATORVASTATIN CALCIUM 20 MG PO TABS: 20.0000 mg | ORAL_TABLET | Freq: Every day | ORAL | Status: DC

## 2013-07-23 MED ORDER — SODIUM CHLORIDE 0.9 % IV SOLN
INTRAVENOUS | Status: DC
  Administered 2013-07-23: 10:00:00 via INTRAVENOUS

## 2013-07-23 MED ORDER — ACETAMINOPHEN 325 MG PO TABS: 650.0000 mg | ORAL_TABLET | ORAL | Status: DC | PRN

## 2013-07-23 NOTE — CV Procedure (Signed)
    Cardiac Catheterization Procedure Note  Name: Frank Matthews MRN: [id number] DOB: [birth year]  Procedure: Left Heart Cath, Selective Coronary Angiography, LV angiography  Indication: CCS class III angina.   Procedural Details: The right wrist was prepped, draped, and anesthetized with 1% lidocaine. Using the modified Seldinger technique, a 5 French sheath was introduced into the right radial artery. 3 mg of verapamil was administered through the sheath, weight-based unfractionated heparin was administered intravenously. Standard Judkins catheters were used for selective coronary angiography and left ventriculography. Catheter exchanges were performed over an exchange length guidewire. There were no immediate procedural complications. A TR band was used for radial hemostasis at the completion of the procedure.  The patient was transferred to the post catheterization recovery area for further monitoring.  Procedural Findings: Hemodynamics: AO 127/79 with a mean of 102 LV 129/14  Coronary angiography: Coronary dominance: right  Left mainstem: The left mainstem arises from the left coronary cusp. The vessel is widely patent with no obstructive disease.  Left anterior descending (LAD): The LAD is normal in caliber. The vessel wraps around the left ventricular apex. The LAD has minor irregularity but no significant stenosis throughout its distribution to the apex. However, the first diagonal has a 90% stenosis in its midportion. There is a subbranch of the diagonal is totally occluded and fills from left to left collaterals. The diagonal is small to medium in caliber.  Left circumflex (LCx): The left circumflex supplies a large intermediate branch with 20-the vessel is medium in caliber. There is 30% mid vessel stenosis. The distal vessel is patent. The PDA is patent. 30% ostial stenosis. The AV circumflex gives off 2 obtuse marginal branches without significant disease.  Right coronary  artery (RCA): The RCA is a dominant vessel.  Left ventriculography: Left ventricular systolic function is normal, LVEF is estimated at 55-65%, there is no significant mitral regurgitation   Final Conclusions:   1. Severe stenosis of the first diagonal branch of the LAD with total occlusion of a subbranch of the diagonal.  2. Nonobstructive stenosis of the LAD, left circumflex, and right coronary arteries  3. Normal left ventricular function.  Recommendations: I would recommend medical therapy for this patient's coronary artery disease. His diagonal branch is small in caliber and it is best suited for medical treatment. Will start him on metoprolol 12.5 mg twice daily. Consider the addition of a long-acting nitrate depending on his response to the beta blocker.  Tonny Bollman 07/23/2013, 11:50 AM

## 2013-07-23 NOTE — H&P (View-Only) (Signed)
 HPI:  This is a very nice 57-year-old gentleman presenting for initial cardiac evaluation. About one year ago the patient developed severe tightness and pain in his chest when walking in the cold weather. At that time, he was visiting family in [place]. He was evaluated by a cardiologist and underwent stress testing. He was told that he would need an angiogram when he returns to the [place].  He has continued to have exertional symptoms. He used to walk 2-3 miles several times per week. Over the last few months he has had progressive symptoms of heaviness in his chest and shortness of breath with walking 1/4 mile. This occurs at a shorter distance when he is walking up a hill. Symptoms resolve with rest. He denies lightheadedness, diaphoresis, or syncope. He's had no palpitations or leg swelling. His chest pain is nonradiating. He has had no symptoms with emotional stress or at rest. He does complain of marked generalized fatigue. He has quit doing many of the things that he enjoys because of his symptoms.  The patient is a lifelong nonsmoker. He does not drink alcohol. He is married with a daughter who attends [employer].  His family history is notable for extensive coronary artery disease. He has 2 brothers who had coronary bypass surgery in their 60s. His father had coronary bypass surgery at age 70.  Outpatient Encounter Prescriptions as of 07/20/2013  Medication Sig  . aspirin 325 MG tablet Take 325 mg by mouth daily.   . [DISCONTINUED] dicyclomine (BENTYL) 20 MG tablet Take 1 tablet (20 mg total) by mouth every 6 (six) hours as needed (for abdominal cramping).  . [DISCONTINUED] docusate sodium (COLACE) 100 MG capsule Take 1 capsule (100 mg total) by mouth every 12 (twelve) hours.  . [DISCONTINUED] ketorolac (TORADOL) 10 MG tablet Take 1 tablet (10 mg total) by mouth every 6 (six) hours as needed for pain.  . [DISCONTINUED] ondansetron (ZOFRAN ODT) 8 MG disintegrating  tablet Take 1 tablet (8 mg total) by mouth every 8 (eight) hours as needed for nausea.  . [DISCONTINUED] oxyCODONE-acetaminophen (PERCOCET/ROXICET) 5-325 MG per tablet Take 2 tablets by mouth every 4 (four) hours as needed for pain.  . [DISCONTINUED] polyethylene glycol (MIRALAX / GLYCOLAX) packet Take 17 g by mouth daily.    Review of patient's allergies indicates no known allergies.  Past Medical History  Diagnosis Date  . Mediterranean fever   . H/O idiopathic seizure     Past Surgical History  Procedure Laterality Date  . Lumbar disc surgery      L5-S1    History   Social History  . Marital Status: Married    Spouse Name: N/A    Number of Children: 1  . Years of Education: N/A   Occupational History  . restauranteur    Social History Main Topics  . Smoking status: Never Smoker   . Smokeless tobacco: Never Used  . Alcohol Use: No  . Drug Use: No  . Sexual Activity: Not on file   Other Topics Concern  . Not on file   Social History Narrative   University graduate-civil engineer   Married '91   1 daughter - '96   Work - restauranteur    Family History  Problem Relation Age of Onset  . Other Father 79    deceased, accidental death  . Other Mother 89    natural causes  . Colon cancer Neg Hx   . Prostate cancer Neg Hx   .   Coronary artery disease Neg Hx   . Heart attack Neg Hx   . Diabetes Brother   . Breast cancer Sister    ROS:  General: no fevers/chills/night sweats Eyes: no blurry vision, diplopia, or amaurosis ENT: no sore throat or hearing loss Resp: no cough, wheezing, or hemoptysis CV: no edema or palpitations GI: no nausea, vomiting, diarrhea, or constipation. Positive for a recent episode of flank and right abdominal pain evaluated by CAT scan of the abdomen and pelvis without any obvious cause of his pain identified. GU: no dysuria, frequency, or hematuria Skin: no rash Neuro: no headache, numbness, tingling, or weakness of  extremities Musculoskeletal: Positive for low back pain Heme: no bleeding, DVT, or easy bruising Endo: no polydipsia or polyuria  BP 122/80  Pulse 72  Ht 5' 6" (1.676 m)  Wt 178 lb (80.74 kg)  BMI 28.74 kg/m2  PHYSICAL EXAM: Pt is alert and oriented, WD, WN, in no distress. HEENT: normal Neck: JVP normal. Carotid upstrokes normal without bruits. No thyromegaly. Lungs: equal expansion, clear bilaterally CV: Apex is discrete and nondisplaced, RRR without murmur or gallop Abd: soft, NT, +BS, no bruit, no hepatosplenomegaly Back: no CVA tenderness Ext: no C/C/E        Femoral pulses 2+= without bruits        DP/PT pulses intact and = Skin: warm and dry without rash Neuro: CNII-XII intact             Strength intact = bilaterally  EKG:  Normal sinus rhythm 72 beats per minute, within normal limits.  ASSESSMENT AND PLAN: This is a 57-year-old gentleman presenting for evaluation of exertional chest pain. His symptoms are highly typical of angina. He has CCS class III symptoms, slowly progressive over the last few months. He has stopped doing many of his normal activities because of fatigue and chest discomfort. He has a very strong family history of multivessel coronary artery disease. I have recommended that we proceed directly with cardiac catheterization and possible PCI. I have reviewed the risks, indications, and alternatives with the patient who understands and agrees to proceed. He understands that serious complications, including vascular injury, bleeding, myocardial infarction, stroke, emergency surgery, and death occur at very low frequency (less than 1%). Will plan a right radial approach.  Hyperlipidemia. Will start the patient on a statin drug if he is found to have coronary artery disease. His LDL was 139 mg/dL.  Frank Matthews 07/20/2013 3:14 PM      

## 2013-07-23 NOTE — Progress Notes (Signed)
Discharge instruction given per MD orders.  Pt and Cg able to verbalize instruction.  Pt to car via wheelchair.

## 2013-07-23 NOTE — Interval H&P Note (Signed)
History and Physical Interval Note:  07/23/2013 11:09 AM  Frank Matthews  has presented today for surgery, with the diagnosis of Chest pain  The various methods of treatment have been discussed with the patient and family. After consideration of risks, benefits and other options for treatment, the patient has consented to  Procedure(s): LEFT HEART CATHETERIZATION WITH CORONARY ANGIOGRAM (N/A) as a surgical intervention .  The patient's history has been reviewed, patient examined, no change in status, stable for surgery.  I have reviewed the patient's chart and labs.  Questions were answered to the patient's satisfaction.    Cath Lab Visit (complete for each Cath Lab visit)  Clinical Evaluation Leading to the Procedure:   ACS: no  Non-ACS:    Anginal Classification: CCS III  Anti-ischemic medical therapy: No Therapy  Non-Invasive Test Results: No non-invasive testing performed  Prior CABG: No previous CABG       Tonny Bollman

## 2013-08-06 ENCOUNTER — Telehealth: Payer: Self-pay

## 2013-08-06 NOTE — Telephone Encounter (Signed)
I called patient and he states his pain has gotten better. He asked that I transfer him to the scheduling desk to make an appt.

## 2013-08-06 NOTE — Telephone Encounter (Signed)
Message copied by Noreene Larsson on Mon Aug 06, 2013  1:23 PM ------      Message from: Illene Regulus E      Created: Mon Aug 06, 2013  1:05 PM       Please call MR. Lucero - how is the right upper quadrant pain? Resolved I hope.  ------

## 2013-08-14 ENCOUNTER — Ambulatory Visit (INDEPENDENT_AMBULATORY_CARE_PROVIDER_SITE_OTHER): Payer: BC Managed Care – PPO | Admitting: Internal Medicine

## 2013-08-14 ENCOUNTER — Encounter: Payer: Self-pay | Admitting: Internal Medicine

## 2013-08-14 VITALS — BP 112/78 | HR 69 | Temp 98.3°F | Wt 180.8 lb

## 2013-08-14 DIAGNOSIS — R109 Unspecified abdominal pain: Secondary | ICD-10-CM

## 2013-08-14 DIAGNOSIS — I2510 Atherosclerotic heart disease of native coronary artery without angina pectoris: Secondary | ICD-10-CM

## 2013-08-14 DIAGNOSIS — R52 Pain, unspecified: Secondary | ICD-10-CM

## 2013-08-14 NOTE — Patient Instructions (Signed)
Very good to see you.  It is good the pain in the right chest is gone. We never found a cause. There are small pulmonary nodules on the CT chest - this is of low concern because you are a low risk patient. W will get a follow up CT in 1 year  Heart disease - minor disease but diffuse. The job now is risk reduction. It is very important to have the LDL cholesterol 80 or less. We can find a statin that you will tolerate.  Blood pressure is great but the metoprolol also improves heart function and reduces the risk of heart attack.   Safe Travels to you and your family.

## 2013-08-14 NOTE — Progress Notes (Signed)
Pre visit review using our clinic review tool, if applicable. No additional management support is needed unless otherwise documented below in the visit note. 

## 2013-08-15 ENCOUNTER — Ambulatory Visit: Payer: BC Managed Care – PPO | Admitting: Cardiovascular Disease

## 2013-08-16 NOTE — Progress Notes (Signed)
   Subjective:    Patient ID: Frank Matthews, male    DOB: February 14, 1956, 58 y.o.   MRN: [id number]  HPI Mr. Frank Matthews presents for follow up. He was seen in late October for RUQ pain. He had an extensive workup including CT angio chest for PE, CT abd/pelvis, abdominal U/S and ED evaluation (notes reviewed). No etiology for his pain was uncovered. Fortunately his discomfort is essentially resolved.  He also had given a h/o typical angina. He had had a cardiology eval in [place] that was abnormal. He was referred to Dr. Excell Seltzer and came to cardiac cath with revealed obstructive disease at 90% of the 1st diagonal, 100% of a subbranch and non-occlusive disease of the LAD. Dr. Excell Seltzer recommended medical treatment.  Since his cath he has been doing well: no chest pain or other cardiac symptoms but he has not resumed full physical exercise. He was to take Metoprolol 12.5 mg bid but has been taking it once a day. He was also to be taking "statin" therapy lipitor 20 mg.  PMH, FamHx and SocHx reviewed for any changes and relevance. Current Outpatient Prescriptions on File Prior to Visit  Medication Sig Dispense Refill  . aspirin 325 MG tablet Take 325 mg by mouth daily.       Marland Kitchen atorvastatin (LIPITOR) 20 MG tablet Take 1 tablet (20 mg total) by mouth daily.  30 tablet  5  . metoprolol tartrate (LOPRESSOR) 25 MG tablet Take 0.5 tablets (12.5 mg total) by mouth 2 (two) times daily.  30 tablet  11   No current facility-administered medications on file prior to visit.      Review of Systems System review is negative for any constitutional, cardiac, pulmonary, GI or neuro symptoms or complaints other than as described in the HPI.     Objective:   Physical Exam Filed Vitals:   08/14/13 1529  BP: 112/78  Pulse: 69  Temp: 98.3 F (36.8 C)   Wt Readings from Last 3 Encounters:  08/14/13 180 lb 12.8 oz (82.01 kg)  07/23/13 108 lb (48.988 kg)  07/23/13 108 lb (48.988 kg)   Gen'l- WNWD healthy appearing  man. HEENT - C&S clear Cor - 2+ radial, RRR Pulm  Normal respirations. Neuro - A&O x 3       Assessment & Plan:

## 2013-08-16 NOTE — Assessment & Plan Note (Signed)
Reviewed cath results with Mr. Frank Matthews. Stressed the importance of taking metoprolol as instructed. Also stressed the importance of continue "statin" therapy to a goal LDL of < 80.  With lipitor he reports minor leg pain.  Plan  resume metoprolol 12. 5 mg bid  Resume lipitor and when he sees DR. Excell Seltzer they can discuss alternative "statin" products.  Keep follow up appointment with Dr. Excell Seltzer.

## 2013-08-16 NOTE — Assessment & Plan Note (Signed)
Flank pain has all but resolved. No further evaluation at this time

## 2013-08-21 ENCOUNTER — Ambulatory Visit (INDEPENDENT_AMBULATORY_CARE_PROVIDER_SITE_OTHER): Payer: BC Managed Care – PPO | Admitting: Cardiovascular Disease

## 2013-08-21 ENCOUNTER — Encounter: Payer: Self-pay | Admitting: Cardiovascular Disease

## 2013-08-21 VITALS — BP 114/70 | HR 73 | Ht 66.0 in | Wt 179.0 lb

## 2013-08-21 DIAGNOSIS — I209 Angina pectoris, unspecified: Secondary | ICD-10-CM

## 2013-08-21 DIAGNOSIS — I2510 Atherosclerotic heart disease of native coronary artery without angina pectoris: Secondary | ICD-10-CM

## 2013-08-21 DIAGNOSIS — E7800 Pure hypercholesterolemia, unspecified: Secondary | ICD-10-CM

## 2013-08-21 NOTE — Patient Instructions (Signed)
Your physician recommends that you return for a FASTING LIPID and LIVER profile in 6 MONTHS--nothing to eat or drink after midnight, lab opens at 7:30 AM.   Your physician wants you to follow-up in: 1 YEAR with Dr Excell Seltzer.  You will receive a reminder letter in the mail two months in advance. If you don't receive a letter, please call our office to schedule the follow-up appointment.  Your physician recommends that you continue on your current medications as directed. Please refer to the Current Medication list given to you today.

## 2013-08-21 NOTE — Progress Notes (Signed)
HPI:   57 year old gentleman presenting for followup evaluation. He was seen in November for exertional angina. He has an extensive family history of CAD. He describes CCS class III anginal symptoms, and cardiac catheterization was recommended. Cardiac catheterization was done and demonstrated severe stenosis of a diagonal branch with occlusion of a small subbranch. The major epicardial vessels had no obstructive disease. LV function is normal. Medical therapy was recommended. Lipids were checked October 30 with a cholesterol of 201, triglycerides 228, HDL 41, and LDL 139.  He's been doing well from a clinical perspective. He's only been taking his metoprolol one time per day. Blood pressures have been normal at home. He denies exertional chest pain or pressure. He has mild shortness of breath at times. He denies edema or palpitations. He has not started regular exercise because he has been fearful to do so.  Outpatient Encounter Prescriptions as of 08/21/2013  Medication Sig  . aspirin 325 MG tablet Take 325 mg by mouth daily.   Marland Kitchen atorvastatin (LIPITOR) 20 MG tablet Take 1 tablet (20 mg total) by mouth daily.  . metoprolol tartrate (LOPRESSOR) 25 MG tablet Take 0.5 tablets (12.5 mg total) by mouth 2 (two) times daily.    No Known Allergies  Past Medical History  Diagnosis Date  . Mediterranean fever   . H/O idiopathic seizure     ROS: Negative except as per HPI  BP 114/70  Pulse 73  Ht 5\' 6"  (1.676 m)  Wt 179 lb (81.194 kg)  BMI 28.91 kg/m2  SpO2 97%  PHYSICAL EXAM: Pt is alert and oriented, NAD HEENT: normal Neck: JVP - normal, carotids 2+= without bruits Lungs: CTA bilaterally CV: RRR without murmur or gallop Abd: soft, NT, Positive BS, no hepatomegaly Ext: no C/C/E, distal pulses intact and equal Skin: warm/dry no rash  Cardiac catheterization 07/23/2013: Procedural Findings:   Hemodynamics:  AO 127/79 with a mean of 102  LV 129/14  Coronary angiography:    Coronary dominance: right  Left mainstem: The left mainstem arises from the left coronary cusp. The vessel is widely patent with no obstructive disease.  Left anterior descending (LAD): The LAD is normal in caliber. The vessel wraps around the left ventricular apex. The LAD has minor irregularity but no significant stenosis throughout its distribution to the apex. However, the first diagonal has a 90% stenosis in its midportion. There is a subbranch of the diagonal is totally occluded and fills from left to left collaterals. The diagonal is small to medium in caliber.  Left circumflex (LCx): The left circumflex supplies a large intermediate branch with 20-the vessel is medium in caliber. There is 30% mid vessel stenosis. The distal vessel is patent. The PDA is patent. 30% ostial stenosis. The AV circumflex gives off 2 obtuse marginal branches without significant disease.  Right coronary artery (RCA): The RCA is a dominant vessel.  Left ventriculography: Left ventricular systolic function is normal, LVEF is estimated at 55-65%, there is no significant mitral regurgitation  Final Conclusions:  1. Severe stenosis of the first diagonal branch of the LAD with total occlusion of a subbranch of the diagonal.  2. Nonobstructive stenosis of the LAD, left circumflex, and right coronary arteries  3. Normal left ventricular function.  Recommendations: I would recommend medical therapy for this patient's coronary artery disease. His diagonal branch is small in caliber and it is best suited for medical treatment. Will start him on metoprolol 12.5 mg twice daily. Consider the addition of a long-acting  nitrate depending on his response to the beta blocker.   ASSESSMENT AND PLAN: 1. Coronary atherosclerosis, native vessel. The patient is stable without symptoms of angina. He understands the importance of taking his beta blocker twice daily. He will continue on aspirin and atorvastatin. I will see him back in one year  for followup unless problems arise. We reviewed in detail the mechanism of acute coronary syndrome, his risk of myocardial infarction, and the rationale for secondary risk reduction. He will contact me if any change in his anginal threshold occurs.  2. Hyperlipidemia. The patient is tolerating atorvastatin 20 mg. He initially had some mild aching in the left lower leg. This has resolved. He will continue this medication with followup labs in 6 months.  Tonny Bollman 08/21/2013 3:17 PM

## 2014-01-23 ENCOUNTER — Other Ambulatory Visit (HOSPITAL_COMMUNITY): Payer: Self-pay | Admitting: Cardiovascular Disease

## 2014-02-19 ENCOUNTER — Other Ambulatory Visit: Payer: BC Managed Care – PPO

## 2014-05-03 ENCOUNTER — Other Ambulatory Visit (INDEPENDENT_AMBULATORY_CARE_PROVIDER_SITE_OTHER): Payer: BC Managed Care – PPO

## 2014-05-03 DIAGNOSIS — I2510 Atherosclerotic heart disease of native coronary artery without angina pectoris: Secondary | ICD-10-CM

## 2014-05-03 DIAGNOSIS — E7800 Pure hypercholesterolemia, unspecified: Secondary | ICD-10-CM

## 2014-05-03 DIAGNOSIS — I209 Angina pectoris, unspecified: Secondary | ICD-10-CM

## 2014-05-04 LAB — HEPATIC FUNCTION PANEL
ALT: 32 U/L (ref 0–53)
AST: 21 U/L (ref 0–37)
Albumin: 4.2 g/dL (ref 3.5–5.2)
Alkaline Phosphatase: 51 U/L (ref 39–117)
Bilirubin, Direct: 0.1 mg/dL (ref 0.0–0.3)
Total Bilirubin: 0.7 mg/dL (ref 0.2–1.2)
Total Protein: 7.3 g/dL (ref 6.0–8.3)

## 2014-05-04 LAB — LIPID PANEL
Cholesterol: 140 mg/dL (ref 0–200)
HDL: 37.8 mg/dL — AB (ref >39.00)
LDL Cholesterol: 75 mg/dL (ref 0–99)
NonHDL: 102.2
TRIGLYCERIDES: 135 mg/dL (ref 0.0–149.0)
Total CHOL/HDL Ratio: 4
VLDL: 27 mg/dL (ref 0.0–40.0)

## 2014-06-28 ENCOUNTER — Other Ambulatory Visit: Payer: Self-pay

## 2014-07-27 ENCOUNTER — Other Ambulatory Visit: Payer: Self-pay | Admitting: Cardiovascular Disease

## 2014-08-06 ENCOUNTER — Encounter: Payer: Self-pay | Admitting: Cardiovascular Disease

## 2014-08-06 ENCOUNTER — Ambulatory Visit (INDEPENDENT_AMBULATORY_CARE_PROVIDER_SITE_OTHER): Payer: BC Managed Care – PPO | Admitting: Cardiovascular Disease

## 2014-08-06 VITALS — BP 132/76 | HR 73 | Ht 66.0 in | Wt 184.4 lb

## 2014-08-06 DIAGNOSIS — E785 Hyperlipidemia, unspecified: Secondary | ICD-10-CM

## 2014-08-06 DIAGNOSIS — I2510 Atherosclerotic heart disease of native coronary artery without angina pectoris: Secondary | ICD-10-CM

## 2014-08-06 MED ORDER — ASPIRIN 81 MG PO TABS: 81.0000 mg | ORAL_TABLET | Freq: Every day | ORAL | Status: AC

## 2014-08-06 MED ORDER — ATORVASTATIN CALCIUM 10 MG PO TABS: 10.0000 mg | ORAL_TABLET | Freq: Every day | ORAL | Status: DC

## 2014-08-06 NOTE — Patient Instructions (Signed)
Your physician has recommended you make the following change in your medication:  DECREASE Aspirin 81mg  take one by mouth daily DECREASE Atorvastatin to 10mg  take one by mouth every evening  Your physician recommends that you return for a FASTING LIPID and LIVER profile in 3 MONTHS--nothing to eat or drink after midnight, lab opens at 7:30 AM  Your physician wants you to follow-up in: 1 YEAR with Dr Burt Knack. You will receive a reminder letter in the mail two months in advance. If you don't receive a letter, please call our office to schedule the follow-up appointment.

## 2014-08-06 NOTE — Progress Notes (Signed)
Background:  The patient is followed for coronary artery disease. He presented in 2014 with exertional angina. The patient was noted to have severe stenosis of a diagonal branch with total occlusion of a small diagonal subbranch. His major coronary arteries had no obstructive disease and LV function was within normal limits. Medical therapy was recommended.  HPI:  58 year-old gentleman presenting for follow-up evaluation. He is having some myalgias in the arms and legs, but not severe. He has no exertional chest pain or pressure. He does have DOE, but no significant limitation unless moderate or high-level physical exertion. No edema, heart palpitations, lightheadedness, or syncope.   Studies:  Lipid Panel     Component Value Date/Time   CHOL 140 05/03/2014 1107   TRIG 135.0 05/03/2014 1107   HDL 37.80* 05/03/2014 1107   CHOLHDL 4 05/03/2014 1107   VLDL 27.0 05/03/2014 1107   LDLCALC 75 05/03/2014 1107   LDLDIRECT 139.2 07/12/2013 1639    Cardiac catheterization 07/23/2013: Procedural Findings:  Hemodynamics:  AO 127/79 with a mean of 102  LV 129/14  Coronary angiography:  Coronary dominance: right  Left mainstem: The left mainstem arises from the left coronary cusp. The vessel is widely patent with no obstructive disease.  Left anterior descending (LAD): The LAD is normal in caliber. The vessel wraps around the left ventricular apex. The LAD has minor irregularity but no significant stenosis throughout its distribution to the apex. However, the first diagonal has a 90% stenosis in its midportion. There is a subbranch of the diagonal is totally occluded and fills from left to left collaterals. The diagonal is small to medium in caliber.  Left circumflex (LCx): The left circumflex supplies a large intermediate branch with 20-the vessel is medium in caliber. There is 30% mid vessel stenosis. The distal vessel is patent. The PDA is patent. 30% ostial stenosis. The AV circumflex  gives off 2 obtuse marginal branches without significant disease.  Right coronary artery (RCA): The RCA is a dominant vessel.  Left ventriculography: Left ventricular systolic function is normal, LVEF is estimated at 55-65%, there is no significant mitral regurgitation  Final Conclusions:  1. Severe stenosis of the first diagonal branch of the LAD with total occlusion of a subbranch of the diagonal.  2. Nonobstructive stenosis of the LAD, left circumflex, and right coronary arteries  3. Normal left ventricular function.  Recommendations: I would recommend medical therapy for this patient's coronary artery disease. His diagonal branch is small in caliber and it is best suited for medical treatment. Will start him on metoprolol 12.5 mg twice daily. Consider the addition of a long-acting nitrate depending on his response to the beta blocker.   Outpatient Encounter Prescriptions as of 08/06/2014  Medication Sig  . aspirin 325 MG tablet Take 325 mg by mouth daily.   Marland Kitchen atorvastatin (LIPITOR) 20 MG tablet TAKE 1 TABLET (20 MG TOTAL) BY MOUTH DAILY.  . metoprolol tartrate (LOPRESSOR) 25 MG tablet TAKE 1/2 TABLET BY MOUTH TWICE A DAY    No Known Allergies  Past Medical History  Diagnosis Date  . Mediterranean fever   . H/O idiopathic seizure     family history includes Breast cancer in his sister; Diabetes in his brother; Other (age of onset: 84) in his father; Other (age of onset: 11) in his mother. There is no history of Colon cancer, Prostate cancer, Coronary artery disease, or Heart attack.   ROS: Positive for low back pain, otherwise negative except as per HPI  BP 132/76 mmHg  Pulse 73  Ht 5\' 6"  (1.676 m)  Wt 184 lb 6.4 oz (83.643 kg)  BMI 29.78 kg/m2  PHYSICAL EXAM: Pt is alert and oriented, NAD HEENT: normal Neck: JVP - normal, carotids 2+= without bruits Lungs: CTA bilaterally CV: RRR without murmur or gallop Abd: soft, NT, Positive BS, no hepatomegaly Ext: no C/C/E,  distal pulses intact and equal Skin: warm/dry no rash  EKG:   Normal sinus rhythm 73 bpm , within normal limits.  ASSESSMENT AND PLAN:  1. Coronary artery disease, native vessel, without symptoms of angina. I reviewed his previous cardiac catheterization study. He will reduce his aspirin 81 mg. He has no limitation related to his cardiac symptoms at present. I will see him back in one year unless problems arise.  2. Hyperlipidemia. The patient is having some side effects to atorvastatin. We discussed potential options. He is going to reduce atorvastatin 10 mg. Can use coenzyme Q 10 as needed. Will repeat lipids and LFTs in 2-3 months. He is going to increase his exercise program. He follows a good diet.   for follow-up I will see him back in one year unless problems arise.  Sherren Mocha, MD 08/06/2014 3:14 PM

## 2014-08-22 ENCOUNTER — Encounter (HOSPITAL_COMMUNITY): Payer: Self-pay | Admitting: Cardiovascular Disease

## 2014-11-05 ENCOUNTER — Other Ambulatory Visit: Payer: BC Managed Care – PPO

## 2014-11-26 ENCOUNTER — Other Ambulatory Visit (INDEPENDENT_AMBULATORY_CARE_PROVIDER_SITE_OTHER): Payer: BLUE CROSS/BLUE SHIELD | Admitting: *Deleted

## 2014-11-26 DIAGNOSIS — E785 Hyperlipidemia, unspecified: Secondary | ICD-10-CM

## 2014-11-26 LAB — HEPATIC FUNCTION PANEL
ALBUMIN: 4.5 g/dL (ref 3.5–5.2)
ALT: 30 U/L (ref 0–53)
AST: 19 U/L (ref 0–37)
Alkaline Phosphatase: 51 U/L (ref 39–117)
Bilirubin, Direct: 0.1 mg/dL (ref 0.0–0.3)
TOTAL PROTEIN: 7.5 g/dL (ref 6.0–8.3)
Total Bilirubin: 0.4 mg/dL (ref 0.2–1.2)

## 2014-11-26 LAB — LIPID PANEL
CHOL/HDL RATIO: 4
Cholesterol: 159 mg/dL (ref 0–200)
HDL: 39.7 mg/dL (ref >39.00)
LDL CALC: 90 mg/dL (ref 0–99)
NonHDL: 119.3
Triglycerides: 146 mg/dL (ref 0.0–149.0)
VLDL: 29.2 mg/dL (ref 0.0–40.0)

## 2015-01-16 ENCOUNTER — Other Ambulatory Visit: Payer: Self-pay

## 2015-01-16 DIAGNOSIS — E785 Hyperlipidemia, unspecified: Secondary | ICD-10-CM

## 2015-01-16 MED ORDER — METOPROLOL TARTRATE 25 MG PO TABS: 12.5000 mg | ORAL_TABLET | Freq: Two times a day (BID) | ORAL | Status: DC

## 2015-01-16 MED ORDER — ATORVASTATIN CALCIUM 10 MG PO TABS: 10.0000 mg | ORAL_TABLET | Freq: Every day | ORAL | Status: DC

## 2015-01-20 ENCOUNTER — Other Ambulatory Visit: Payer: Self-pay | Admitting: *Deleted

## 2015-01-20 DIAGNOSIS — E785 Hyperlipidemia, unspecified: Secondary | ICD-10-CM

## 2015-01-20 MED ORDER — ATORVASTATIN CALCIUM 10 MG PO TABS: 10.0000 mg | ORAL_TABLET | Freq: Every day | ORAL | Status: DC

## 2015-01-20 MED ORDER — METOPROLOL TARTRATE 25 MG PO TABS: 12.5000 mg | ORAL_TABLET | Freq: Two times a day (BID) | ORAL | Status: DC

## 2015-07-08 ENCOUNTER — Telehealth: Payer: Self-pay | Admitting: Cardiovascular Disease

## 2015-07-08 DIAGNOSIS — E785 Hyperlipidemia, unspecified: Secondary | ICD-10-CM

## 2015-07-08 NOTE — Telephone Encounter (Signed)
New Message Pt is to see Dr Burt Knack in Nov/2016. Pt offered next avail w/ Dr Burt Knack and nexxt avail APP appt- pt refused and requested to speak w/ RN. Please call back and discuss.

## 2015-07-09 MED ORDER — ATORVASTATIN CALCIUM 10 MG PO TABS: 10.0000 mg | ORAL_TABLET | Freq: Every day | ORAL | Status: DC

## 2015-07-09 MED ORDER — METOPROLOL TARTRATE 25 MG PO TABS: 12.5000 mg | ORAL_TABLET | Freq: Two times a day (BID) | ORAL | Status: DC

## 2015-07-09 NOTE — Telephone Encounter (Signed)
I spoke with the pt and made him aware that I will place him on the wait list for an appointment with Dr Burt Knack.  The pt will be out of town Nov 16-20.

## 2015-07-23 NOTE — Telephone Encounter (Signed)
Left message on machine for pt to contact the office to schedule appointment with Dr Burt Knack on 07/29/15.

## 2015-07-23 NOTE — Telephone Encounter (Signed)
Pt scheduled to see Dr Burt Knack 07/29/15.

## 2015-07-29 ENCOUNTER — Encounter: Payer: Self-pay | Admitting: Cardiovascular Disease

## 2015-07-29 ENCOUNTER — Ambulatory Visit (INDEPENDENT_AMBULATORY_CARE_PROVIDER_SITE_OTHER): Payer: BLUE CROSS/BLUE SHIELD | Admitting: Cardiovascular Disease

## 2015-07-29 ENCOUNTER — Other Ambulatory Visit: Payer: Self-pay | Admitting: Cardiovascular Disease

## 2015-07-29 DIAGNOSIS — I25118 Atherosclerotic heart disease of native coronary artery with other forms of angina pectoris: Secondary | ICD-10-CM

## 2015-07-29 DIAGNOSIS — I2510 Atherosclerotic heart disease of native coronary artery without angina pectoris: Secondary | ICD-10-CM | POA: Diagnosis not present

## 2015-07-29 DIAGNOSIS — E7800 Pure hypercholesterolemia, unspecified: Secondary | ICD-10-CM

## 2015-07-29 LAB — COMPREHENSIVE METABOLIC PANEL
ALT: 28 U/L (ref 9–46)
AST: 23 U/L (ref 10–35)
Albumin: 4.4 g/dL (ref 3.6–5.1)
Alkaline Phosphatase: 50 U/L (ref 40–115)
BUN: 17 mg/dL (ref 7–25)
CHLORIDE: 100 mmol/L (ref 98–110)
CO2: 29 mmol/L (ref 20–31)
Calcium: 10.1 mg/dL (ref 8.6–10.3)
Creat: 0.82 mg/dL (ref 0.70–1.33)
GLUCOSE: 108 mg/dL — AB (ref 65–99)
POTASSIUM: 5 mmol/L (ref 3.5–5.3)
Sodium: 139 mmol/L (ref 135–146)
Total Bilirubin: 0.4 mg/dL (ref 0.2–1.2)
Total Protein: 7.7 g/dL (ref 6.1–8.1)

## 2015-07-29 LAB — LIPID PANEL
CHOLESTEROL: 134 mg/dL (ref 125–200)
HDL: 38 mg/dL — ABNORMAL LOW (ref >=40)
LDL Cholesterol: 69 mg/dL (ref <130)
Total CHOL/HDL Ratio: 3.5 Ratio (ref <=5.0)
Triglycerides: 136 mg/dL (ref <150)
VLDL: 27 mg/dL (ref <30)

## 2015-07-29 NOTE — Patient Instructions (Signed)
Medication Instructions:  Your physician recommends that you continue on your current medications as directed. Please refer to the Current Medication list given to you today.  Labwork: Your physician recommends that you have lab work today: LIPID and CMP  Testing/Procedures: No new orders.   Follow-Up: Your physician wants you to follow-up in: 1 YEAR with Dr Cooper. You will receive a reminder letter in the mail two months in advance. If you don't receive a letter, please call our office to schedule the follow-up appointment.   Any Other Special Instructions Will Be Listed Below (If Applicable).     If you need a refill on your cardiac medications before your next appointment, please call your pharmacy.   

## 2015-07-29 NOTE — Progress Notes (Signed)
Cardiology Office Note Date:  07/29/2015   ID:  Frank Matthews, DOB [birth year], MRN [id number]  PCP:  No primary care provider on file.  Cardiologist:  Sherren Mocha, MD    Chief Complaint  Patient presents with  . Follow-up    no refills    History of Present Illness: Frank Matthews is a 59 y.o. male who presents for follow-up of coronary artery disease. He initially presented in 2014 with typical symptoms of exertional angina. He underwent cardiac catheterization demonstrating severe stenosis of a diagonal branch and total occlusion of a small diagonal subbranch. Medical therapy was recommended. He had no coronary obstruction in his major coronary branches. Medical therapy was recommended. He was started on a beta blocker at that time.  Today, the patient presents with no symptoms of chest pain or pressure. He admits to mild dyspnea with exertion when he climbs 2-3 flights of stairs. He has not engaged in any regular exercise. He denies orthopnea, PND, or leg swelling. He's had 3 episodes of heart palpitations occurring less than 1 minute each. These have had a sudden onset and termination with no clear pattern or associated symptoms.  Past Medical History  Diagnosis Date  . Mediterranean fever   . H/O idiopathic seizure     Past Surgical History  Procedure Laterality Date  . Lumbar disc surgery      L5-S1  . Left heart catheterization with coronary angiogram N/A 07/23/2013    Procedure: LEFT HEART CATHETERIZATION WITH CORONARY ANGIOGRAM;  Surgeon: Blane Ohara, MD;  Location: [facility] CATH LAB;  Service: Cardiovascular;  Laterality: N/A;    Current Outpatient Prescriptions  Medication Sig Dispense Refill  . aspirin 81 MG tablet Take 1 tablet (81 mg total) by mouth daily.    Marland Kitchen atorvastatin (LIPITOR) 10 MG tablet Take 1 tablet (10 mg total) by mouth daily at 6 PM. 90 tablet 3  . metoprolol tartrate (LOPRESSOR) 25 MG tablet Take 0.5 tablets (12.5 mg total) by mouth 2 (two)  times daily. 90 tablet 3   No current facility-administered medications for this visit.    Allergies:   Review of patient's allergies indicates no known allergies.   Social History:  The patient  reports that he has never smoked. He has never used smokeless tobacco. He reports that he does not drink alcohol or use illicit drugs.   Family History:  The patient's  family history includes Breast cancer in his sister; Diabetes in his brother; Other (age of onset: 37) in his father; Other (age of onset: 23) in his mother. There is no history of Colon cancer, Prostate cancer, Coronary artery disease, or Heart attack.    ROS:  Please see the history of present illness.  Otherwise, review of systems is positive for palpitations, low back pain.  All other systems are reviewed and negative.   PHYSICAL EXAM: VS:  BP 120/78 mmHg  Pulse 70  Ht 5' 6" (1.676 m)  Wt 185 lb 1.9 oz (83.97 kg)  BMI 29.89 kg/m2 , BMI Body mass index is 29.89 kg/(m^2). GEN: Well nourished, well developed, in no acute distress HEENT: normal Neck: no JVD, no masses. No carotid bruits Cardiac: RRR without murmur or gallop                Respiratory:  clear to auscultation bilaterally, normal work of breathing GI: soft, nontender, nondistended, + BS MS: no deformity or atrophy Ext: no pretibial edema, pedal pulses 2+= bilaterally Skin: warm and dry,  no rash Neuro:  Strength and sensation are intact Psych: euthymic mood, full affect  EKG:  EKG is ordered today. The ekg ordered today shows normal sinus rhythm 70 bpm, within normal limits.  Recent Labs: 11/26/2014: ALT 30   Lipid Panel     Component Value Date/Time   CHOL 159 11/26/2014 1015   TRIG 146.0 11/26/2014 1015   HDL 39.70 11/26/2014 1015   CHOLHDL 4 11/26/2014 1015   VLDL 29.2 11/26/2014 1015   LDLCALC 90 11/26/2014 1015   LDLDIRECT 139.2 07/12/2013 1639      Wt Readings from Last 3 Encounters:  07/29/15 185 lb 1.9 oz (83.97 kg)  08/06/14 184 lb 6.4  oz (83.643 kg)  08/21/13 179 lb (81.194 kg)     Cardiac Studies Reviewed: Cardiac catheterization 07/23/2013: Procedural Findings:  Hemodynamics:  AO 127/79 with a mean of 102  LV 129/14  Coronary angiography:  Coronary dominance: right  Left mainstem: The left mainstem arises from the left coronary cusp. The vessel is widely patent with no obstructive disease.  Left anterior descending (LAD): The LAD is normal in caliber. The vessel wraps around the left ventricular apex. The LAD has minor irregularity but no significant stenosis throughout its distribution to the apex. However, the first diagonal has a 90% stenosis in its midportion. There is a subbranch of the diagonal is totally occluded and fills from left to left collaterals. The diagonal is small to medium in caliber.  Left circumflex (LCx): The left circumflex supplies a large intermediate branch with 20-the vessel is medium in caliber. There is 30% mid vessel stenosis. The distal vessel is patent. The PDA is patent. 30% ostial stenosis. The AV circumflex gives off 2 obtuse marginal branches without significant disease.  Right coronary artery (RCA): The RCA is a dominant vessel.  Left ventriculography: Left ventricular systolic function is normal, LVEF is estimated at 55-65%, there is no significant mitral regurgitation  Final Conclusions:  1. Severe stenosis of the first diagonal branch of the LAD with total occlusion of a subbranch of the diagonal.  2. Nonobstructive stenosis of the LAD, left circumflex, and right coronary arteries  3. Normal left ventricular function.  Recommendations: I would recommend medical therapy for this patient's coronary artery disease. His diagonal branch is small in caliber and it is best suited for medical treatment. Will start him on metoprolol 12.5 mg twice daily. Consider the addition of a long-acting nitrate depending on his response to the beta blocker.  ASSESSMENT AND PLAN: 1.  CAD,  native vessel, without symptoms of angina: The patient has CCS class I symptoms at this time. He will continue on medical therapy. He is tolerating a statin drug and metoprolol.  2. Hyperlipidemia: At last year's visit, he complained of myalgias. Atorvastatin was reduced. Myalgias have now resolved. Will repeat lipids and LFTs today. We discussed the importance of an exercise program and lifestyle modification as part of his ongoing treatment and risk reduction.  3. Palpitations:  Fairly benign pattern. If symptoms increase he will notify acid we will consider outpatient monitoring at that time. At this point, with rare symptoms, outpatient monitoring would likely be low yield.  Current medicines are reviewed with the patient today.  The patient does not have concerns regarding medicines.  Labs/ tests ordered today include:   Orders Placed This Encounter  Procedures  . Comp Met (CMET)  . Lipid panel  . EKG 12-Lead   Disposition:   FU one year  Signed, Cooper, Michael, MD    07/29/2015 1:34 PM    [facility] [address], [place], [place]  [address] Phone: [phone]; Fax: [phone]

## 2015-11-28 ENCOUNTER — Encounter: Payer: Self-pay | Admitting: Internal Medicine

## 2015-11-28 ENCOUNTER — Other Ambulatory Visit (INDEPENDENT_AMBULATORY_CARE_PROVIDER_SITE_OTHER): Payer: BLUE CROSS/BLUE SHIELD

## 2015-11-28 ENCOUNTER — Ambulatory Visit (INDEPENDENT_AMBULATORY_CARE_PROVIDER_SITE_OTHER): Payer: BLUE CROSS/BLUE SHIELD | Admitting: Internal Medicine

## 2015-11-28 VITALS — BP 126/76 | HR 73 | Temp 98.3°F | Resp 16 | Wt 189.0 lb

## 2015-11-28 DIAGNOSIS — Z125 Encounter for screening for malignant neoplasm of prostate: Secondary | ICD-10-CM | POA: Diagnosis not present

## 2015-11-28 DIAGNOSIS — Z1211 Encounter for screening for malignant neoplasm of colon: Secondary | ICD-10-CM

## 2015-11-28 DIAGNOSIS — Z139 Encounter for screening, unspecified: Secondary | ICD-10-CM

## 2015-11-28 DIAGNOSIS — R7303 Prediabetes: Secondary | ICD-10-CM

## 2015-11-28 DIAGNOSIS — E7800 Pure hypercholesterolemia, unspecified: Secondary | ICD-10-CM

## 2015-11-28 DIAGNOSIS — I25119 Atherosclerotic heart disease of native coronary artery with unspecified angina pectoris: Secondary | ICD-10-CM

## 2015-11-28 DIAGNOSIS — R41840 Attention and concentration deficit: Secondary | ICD-10-CM

## 2015-11-28 DIAGNOSIS — R140 Abdominal distension (gaseous): Secondary | ICD-10-CM | POA: Insufficient documentation

## 2015-11-28 LAB — LIPID PANEL
CHOLESTEROL: 155 mg/dL (ref 0–200)
HDL: 38.6 mg/dL — ABNORMAL LOW (ref >39.00)
LDL Cholesterol: 87 mg/dL (ref 0–99)
NonHDL: 116.04
TRIGLYCERIDES: 143 mg/dL (ref 0.0–149.0)
Total CHOL/HDL Ratio: 4
VLDL: 28.6 mg/dL (ref 0.0–40.0)

## 2015-11-28 LAB — COMPREHENSIVE METABOLIC PANEL
ALBUMIN: 4.5 g/dL (ref 3.5–5.2)
ALT: 25 U/L (ref 0–53)
AST: 17 U/L (ref 0–37)
Alkaline Phosphatase: 53 U/L (ref 39–117)
BILIRUBIN TOTAL: 0.5 mg/dL (ref 0.2–1.2)
BUN: 16 mg/dL (ref 6–23)
CO2: 32 mEq/L (ref 19–32)
Calcium: 10.1 mg/dL (ref 8.4–10.5)
Chloride: 102 mEq/L (ref 96–112)
Creatinine, Ser: 0.83 mg/dL (ref 0.40–1.50)
GFR: 101.29 mL/min (ref >60.00)
Glucose, Bld: 112 mg/dL — ABNORMAL HIGH (ref 70–99)
POTASSIUM: 4.4 meq/L (ref 3.5–5.1)
Sodium: 141 mEq/L (ref 135–145)
TOTAL PROTEIN: 8 g/dL (ref 6.0–8.3)

## 2015-11-28 LAB — CBC WITH DIFFERENTIAL/PLATELET
Basophils Absolute: 0 10*3/uL (ref 0.0–0.1)
Basophils Relative: 0.3 % (ref 0.0–3.0)
EOS PCT: 3.7 % (ref 0.0–5.0)
Eosinophils Absolute: 0.3 10*3/uL (ref 0.0–0.7)
HCT: 43.7 % (ref 39.0–52.0)
HEMOGLOBIN: 14.8 g/dL (ref 13.0–17.0)
LYMPHS PCT: 23.1 % (ref 12.0–46.0)
Lymphs Abs: 2.2 10*3/uL (ref 0.7–4.0)
MCHC: 33.8 g/dL (ref 30.0–36.0)
MCV: 85.3 fl (ref 78.0–100.0)
MONOS PCT: 5.9 % (ref 3.0–12.0)
Monocytes Absolute: 0.6 10*3/uL (ref 0.1–1.0)
Neutro Abs: 6.3 10*3/uL (ref 1.4–7.7)
Neutrophils Relative %: 67 % (ref 43.0–77.0)
Platelets: 259 10*3/uL (ref 150.0–400.0)
RBC: 5.12 Mil/uL (ref 4.22–5.81)
RDW: 13.3 % (ref 11.5–15.5)
WBC: 9.4 10*3/uL (ref 4.0–10.5)

## 2015-11-28 LAB — TSH: TSH: 2.18 u[IU]/mL (ref 0.35–4.50)

## 2015-11-28 LAB — HEMOGLOBIN A1C: HEMOGLOBIN A1C: 6.4 % (ref 4.6–6.5)

## 2015-11-28 NOTE — Assessment & Plan Note (Addendum)
Nonspecific without concerning symptoms - no diarrhea, blood Abdominal exam is normal Refer to GI - never had a colonoscopy and due for one Can try probiotics Check blood work

## 2015-11-28 NOTE — Assessment & Plan Note (Signed)
Asymptomatic  Following with cardiology Compliant with medications Work on increasing exercise On asa, statin, BB Follow up with me annually

## 2015-11-28 NOTE — Patient Instructions (Signed)
   Test(s) ordered today. Your results will be released to [facility] (or called to you) after review, usually within 72hours after test completion. If any changes need to be made, you will be notified at that same time.  All other Health Maintenance issues reviewed.   All recommended immunizations and age-appropriate screenings are up-to-date.  No immunizations administered today.   Medications reviewed and updated.  No changes recommended at this time.   A referral was ordered for GI for a colonoscopy.    Please followup annually

## 2015-11-28 NOTE — Progress Notes (Signed)
Subjective:    Patient ID: Frank Matthews, male    DOB: [birth year], 58 y.o.   MRN: [id number]  HPI He is here to establish with a new pcp.    CAD: He is taking his medication daily. He is compliant with a low sodium diet.  He denies chest pain, palpitations, edema, shortness of breath and regular headaches. He is exercising minimally.     Hyperlipidemia: He is taking his medication daily. He is compliant with a low fat/cholesterol diet. He is exercising minimally. He denies myalgias at this dose, but can not tolerate a higher dose.   For the past year he has had difficulty focusing/concentrating, remembering things. He gets distracted fast. He finds now that he is dependent on lists or will not remember to do something without a list.  The difficulty focusing he notices most when doing something like paperwork - he has a hard time completing it.  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      Abdominal bloating:  He has felt bloated for about one year.  The bloating sensation is constant.  He feels it is affecting his breathing - not able to breath as good.  He has abdominal cramping/pain on occasion, but not regular.  His bowels are normal and he denies blood in the stool.  He denies changes in his diet.  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  He was there recently.  These symptoms started before this last visit.    He does some walking for exercise - about 1 mile every other day.   Medications and allergies reviewed with patient and updated if appropriate.  Patient Active Problem List   Diagnosis Date Noted  . Acute right flank pain 07/15/2013  . CAD (coronary artery disease) 07/15/2013  . Other malaise and fatigue 12/04/2011  . Regurgitation 12/02/2011  . Elevated cholesterol 12/02/2011  . MICROSCOPIC HEMATURIA 11/09/2008    Current Outpatient Prescriptions  on File Prior to Visit  Medication Sig Dispense Refill  . aspirin 81 MG tablet Take 1 tablet (81 mg total) by mouth daily.    Marland Kitchen atorvastatin (LIPITOR) 10 MG tablet Take 1 tablet (10 mg total) by mouth daily at 6 PM. 90 tablet 3  . metoprolol tartrate (LOPRESSOR) 25 MG tablet Take 0.5 tablets (12.5 mg total) by mouth 2 (two) times daily. 90 tablet 3   No current facility-administered medications on file prior to visit.    Past Medical History  Diagnosis Date  . Mediterranean fever   . H/O idiopathic seizure     Past Surgical History  Procedure Laterality Date  . Lumbar disc surgery      L5-S1  . Left heart catheterization with coronary angiogram N/A 07/23/2013    Procedure: LEFT HEART CATHETERIZATION WITH CORONARY ANGIOGRAM;  Surgeon: Blane Ohara, MD;  Location: [facility] CATH LAB;  Service: Cardiovascular;  Laterality: N/A;    Social History   Social History  . Marital Status: Married    Spouse Name: N/A  . Number of Children: 1  . Years of Education: N/A   Occupational History  . restauranteur    Social History Main Topics  . Smoking status: Never Smoker   . Smokeless tobacco: Never Used  . Alcohol Use: No  . Drug Use: No  . Sexual Activity: Not on file  Other Topics Concern  . 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 Other Father 20    deceased, accidental death  . Other Mother 35    natural causes  . Colon cancer Neg Hx   . Prostate cancer Neg Hx   . Coronary artery disease Neg Hx   . Heart attack Neg Hx   . Diabetes Brother   . Breast cancer Sister     Review of Systems  Constitutional: Negative for fever, chills, appetite change and unexpected weight change.       Weak/fatigue when he first wakes up - improves over an hour or so  Respiratory: Negative for cough, shortness of breath and wheezing.     Cardiovascular: Positive for leg swelling (mild). Negative for chest pain and palpitations.  Gastrointestinal: Positive for nausea (sometimes). Negative for abdominal pain, diarrhea, constipation and blood in stool.       No gerd  Genitourinary: Negative for dysuria and hematuria.  Neurological: Positive for dizziness (occasional). Negative for light-headedness and headaches.  Psychiatric/Behavioral: Positive for sleep disturbance (sleeps 7 hrs on average). Negative for dysphoric mood. The patient is not nervous/anxious.        Objective:   Filed Vitals:   11/28/15 0913  BP: 126/76  Pulse: 73  Temp: 98.3 F (36.8 C)  Resp: 16   Filed Weights   11/28/15 0913  Weight: 189 lb (85.73 kg)   Body mass index is 30.52 kg/(m^2).   Physical Exam Constitutional: Appears well-developed and well-nourished. No distress.  Neck: Neck supple. No tracheal deviation present. No thyromegaly present.  No carotid bruit. No cervical adenopathy.   Cardiovascular: Normal rate, regular rhythm and normal heart sounds.   No murmur heard.  No edema Pulmonary/Chest: Effort normal and breath sounds normal. No respiratory distress. No wheezes.  Abdomen: soft, nontender, nondistended, no HSM, no mass Psych: normal mood and affect       Assessment & Plan:   Lack of focus, concentration, memory concerns He denies anxiety or depression He has never had an issue with ADD and I do not think that is the case and he agrees/understands No concerning memory difficulties - somewhat normal - he will just monitor Check basic blood work  See Problem List for Assessment and Plan of chronic medical problems.  Follow up annually

## 2015-11-28 NOTE — Progress Notes (Signed)
Pre visit review using our clinic review tool, if applicable. No additional management support is needed unless otherwise documented below in the visit note. 

## 2015-11-28 NOTE — Assessment & Plan Note (Signed)
On lipitor 10 mg daily - can not tolerate a higher dose due to muscle aches Lipid panel well controlled - will recheck

## 2015-11-29 LAB — PSA, TOTAL AND FREE
PSA FREE PCT: 50 % (ref >25)
PSA FREE: 0.11 ng/mL
PSA: 0.22 ng/mL (ref <=4.00)

## 2015-11-29 LAB — HEPATITIS C ANTIBODY: HCV AB: NEGATIVE

## 2015-11-30 ENCOUNTER — Encounter: Payer: Self-pay | Admitting: Internal Medicine

## 2015-11-30 DIAGNOSIS — E1169 Type 2 diabetes mellitus with other specified complication: Secondary | ICD-10-CM | POA: Insufficient documentation

## 2015-11-30 DIAGNOSIS — E119 Type 2 diabetes mellitus without complications: Secondary | ICD-10-CM | POA: Insufficient documentation

## 2015-11-30 DIAGNOSIS — R7303 Prediabetes: Principal | ICD-10-CM

## 2015-12-02 ENCOUNTER — Encounter: Payer: Self-pay | Admitting: Gastroenterology

## 2015-12-12 ENCOUNTER — Ambulatory Visit: Payer: BLUE CROSS/BLUE SHIELD | Admitting: Internal Medicine

## 2016-01-05 ENCOUNTER — Ambulatory Visit (AMBULATORY_SURGERY_CENTER): Payer: Self-pay | Admitting: *Deleted

## 2016-01-05 VITALS — Ht 67.0 in | Wt 186.0 lb

## 2016-01-05 DIAGNOSIS — Z1211 Encounter for screening for malignant neoplasm of colon: Secondary | ICD-10-CM

## 2016-01-05 NOTE — Progress Notes (Signed)
Patient denies any allergies to eggs or soy. Patient denies any problems with anesthesia/sedation. Patient denies any oxygen use at home and does not take any diet/weight loss medications. Patient declined EMMI education video.   

## 2016-01-14 ENCOUNTER — Encounter: Payer: Self-pay | Admitting: Gastroenterology

## 2016-01-19 ENCOUNTER — Encounter: Payer: Self-pay | Admitting: Gastroenterology

## 2016-01-21 ENCOUNTER — Ambulatory Visit (AMBULATORY_SURGERY_CENTER): Payer: BLUE CROSS/BLUE SHIELD | Admitting: Gastroenterology

## 2016-01-21 ENCOUNTER — Encounter: Payer: Self-pay | Admitting: Gastroenterology

## 2016-01-21 VITALS — BP 114/66 | HR 63 | Temp 97.5°F | Resp 19 | Ht 67.0 in | Wt 186.0 lb

## 2016-01-21 DIAGNOSIS — D124 Benign neoplasm of descending colon: Secondary | ICD-10-CM

## 2016-01-21 DIAGNOSIS — Z1211 Encounter for screening for malignant neoplasm of colon: Secondary | ICD-10-CM

## 2016-01-21 MED ORDER — SODIUM CHLORIDE 0.9 % IV SOLN: 500.0000 mL | INTRAVENOUS | Status: DC

## 2016-01-21 NOTE — Progress Notes (Signed)
Called to room to assist during endoscopic procedure.  Patient ID and intended procedure confirmed with present staff. Received instructions for my participation in the procedure from the performing physician.  

## 2016-01-21 NOTE — Patient Instructions (Signed)
YOU HAD AN ENDOSCOPIC PROCEDURE TODAY AT THE [facility]:   Refer to the procedure report that was given to you for any specific questions about what was found during the examination.  If the procedure report does not answer your questions, please call your gastroenterologist to clarify.  If you requested that your care partner not be given the details of your procedure findings, then the procedure report has been included in a sealed envelope for you to review at your convenience later.  YOU SHOULD EXPECT: Some feelings of bloating in the abdomen. Passage of more gas than usual.  Walking can help get rid of the air that was put into your GI tract during the procedure and reduce the bloating. If you had a lower endoscopy (such as a colonoscopy or flexible sigmoidoscopy) you may notice spotting of blood in your stool or on the toilet paper. If you underwent a bowel prep for your procedure, you may not have a normal bowel movement for a few days.  Please Note:  You might notice some irritation and congestion in your nose or some drainage.  This is from the oxygen used during your procedure.  There is no need for concern and it should clear up in a day or so.  SYMPTOMS TO REPORT IMMEDIATELY:   Following lower endoscopy (colonoscopy or flexible sigmoidoscopy):  Excessive amounts of blood in the stool  Significant tenderness or worsening of abdominal pains  Swelling of the abdomen that is new, acute  Fever of 100F or higher  For urgent or emergent issues, a gastroenterologist can be reached at any hour by calling [phone].  DIET: Your first meal following the procedure should be a small meal and then it is ok to progress to your normal diet. Heavy or fried foods are harder to digest and may make you feel nauseous or bloated.  Likewise, meals heavy in dairy and vegetables can increase bloating.  Drink plenty of fluids but you should avoid alcoholic beverages for 24 hours.  ACTIVITY:   You should plan to take it easy for the rest of today and you should NOT DRIVE or use heavy machinery until tomorrow (because of the sedation medicines used during the test).    FOLLOW UP: Our staff will call the number listed on your records the next business day following your procedure to check on you and address any questions or concerns that you may have regarding the information given to you following your procedure. If we do not reach you, we will leave a message.  However, if you are feeling well and you are not experiencing any problems, there is no need to return our call.  We will assume that you have returned to your regular daily activities without incident.  If any biopsies were taken you will be contacted by phone or by letter within the next 1-3 weeks.  Please call us at [phone] if you have not heard about the biopsies in 3 weeks.   SIGNATURES/CONFIDENTIALITY: You and/or your care partner have signed paperwork which will be entered into your electronic medical record.  These signatures attest to the fact that that the information above on your After Visit Summary has been reviewed and is understood.  Full responsibility of the confidentiality of this discharge information lies with you and/or your care-partner.  Please read over handouts about polyps  Continue your normal medications 

## 2016-01-21 NOTE — Op Note (Signed)
[place] Patient Name: Frank Matthews Procedure Date: 01/21/2016 8:15 AM MRN: [id number] Endoscopist: Milus Banister , MD Age: 60 Date of Birth: 11/27/1955 Gender: Male Procedure:                Colonoscopy Indications:              Screening for colorectal malignant neoplasm Medicines:                Monitored Anesthesia Care Procedure:                Pre-Anesthesia Assessment:                           - Prior to the procedure, a History and Physical                            was performed, and patient medications and                            allergies were reviewed. The patient's tolerance of                            previous anesthesia was also reviewed. The risks                            and benefits of the procedure and the sedation                            options and risks were discussed with the patient.                            All questions were answered, and informed consent                            was obtained. Prior Anticoagulants: The patient has                            taken no previous anticoagulant or antiplatelet                            agents. ASA Grade Assessment: II - A patient with                            mild systemic disease. After reviewing the risks                            and benefits, the patient was deemed in                            satisfactory condition to undergo the procedure.                           After obtaining informed consent, the colonoscope  was passed under direct vision. Throughout the                            procedure, the patient's blood pressure, pulse, and                            oxygen saturations were monitored continuously. The                            Model CF-HQ190L [id number]) scope was introduced                            through the anus and advanced to the the cecum,                            identified by appendiceal orifice and ileocecal                     valve. The colonoscopy was performed without                            difficulty. The patient tolerated the procedure                            well. The quality of the bowel preparation was                            excellent. The ileocecal valve, appendiceal                            orifice, and rectum were photographed. Scope In: 8:20:01 AM Scope Out: 8:32:11 AM Scope Withdrawal Time: 0 hours 9 minutes 44 seconds  Total Procedure Duration: 0 hours 12 minutes 10 seconds  Findings:                 A 4 mm polyp was found in the descending colon. The                            polyp was sessile. The polyp was removed with a                            cold snare. Resection and retrieval were complete.                           The exam was otherwise without abnormality on                            direct and retroflexion views. Complications:            No immediate complications. Estimated blood loss:                            None. Estimated Blood Loss:     Estimated blood loss: none. Impression:               - One 4 mm  polyp in the descending colon, removed                            with a cold snare. Resected and retrieved.                           - The examination was otherwise normal on direct                            and retroflexion views. Recommendation:           - Patient has a contact number available for                            emergencies. The signs and symptoms of potential                            delayed complications were discussed with the                            patient. Return to normal activities tomorrow.                            Written discharge instructions were provided to the                            patient.                           - Resume previous diet.                           - Continue present medications.                           You will receive a letter within 2-3 weeks with the                             pathology results and my final recommendations.                           If the polyp(s) is proven to be 'pre-cancerous' on                            pathology, you will need repeat colonoscopy in 5                            years. If the polyp(s) is NOT 'precancerous' on                            pathology then you should repeat colon cancer                            screening in 10 years with colonoscopy without need  for colon cancer screening by any method prior to                            then (including stool testing). Milus Banister, MD 01/21/2016 8:35:05 AM This report has been signed electronically.

## 2016-01-21 NOTE — Progress Notes (Signed)
A/ox3 pleased with MAC, report to Kristen RN 

## 2016-01-22 ENCOUNTER — Telehealth: Payer: Self-pay | Admitting: *Deleted

## 2016-01-22 NOTE — Telephone Encounter (Signed)
  Follow up Call-  Call back number 01/21/2016  Post procedure Call Back phone  # [phone]  Permission to leave phone message Yes     Patient questions:  Message left to call us if necessary.

## 2016-01-29 ENCOUNTER — Encounter: Payer: Self-pay | Admitting: Gastroenterology

## 2016-06-25 ENCOUNTER — Telehealth: Payer: Self-pay | Admitting: Cardiovascular Disease

## 2016-06-25 DIAGNOSIS — I2510 Atherosclerotic heart disease of native coronary artery without angina pectoris: Secondary | ICD-10-CM

## 2016-06-25 NOTE — Telephone Encounter (Signed)
Pt called in to schedule his follow up appointment and only wants to see Dr Burt Knack.  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

## 2016-06-25 NOTE — Telephone Encounter (Signed)
I spoke with the pt and appointments scheduled for lab work and office visit.

## 2016-07-06 ENCOUNTER — Ambulatory Visit (INDEPENDENT_AMBULATORY_CARE_PROVIDER_SITE_OTHER): Payer: BLUE CROSS/BLUE SHIELD | Admitting: Internal Medicine

## 2016-07-06 ENCOUNTER — Other Ambulatory Visit (INDEPENDENT_AMBULATORY_CARE_PROVIDER_SITE_OTHER): Payer: BLUE CROSS/BLUE SHIELD

## 2016-07-06 ENCOUNTER — Encounter: Payer: Self-pay | Admitting: Internal Medicine

## 2016-07-06 VITALS — BP 140/82 | HR 75 | Temp 97.9°F | Resp 16 | Ht 67.0 in | Wt 184.0 lb

## 2016-07-06 DIAGNOSIS — R252 Cramp and spasm: Secondary | ICD-10-CM

## 2016-07-06 DIAGNOSIS — R7303 Prediabetes: Secondary | ICD-10-CM

## 2016-07-06 DIAGNOSIS — E7800 Pure hypercholesterolemia, unspecified: Secondary | ICD-10-CM

## 2016-07-06 DIAGNOSIS — I2510 Atherosclerotic heart disease of native coronary artery without angina pectoris: Secondary | ICD-10-CM | POA: Diagnosis not present

## 2016-07-06 DIAGNOSIS — Z23 Encounter for immunization: Secondary | ICD-10-CM

## 2016-07-06 DIAGNOSIS — M545 Low back pain: Secondary | ICD-10-CM

## 2016-07-06 DIAGNOSIS — R140 Abdominal distension (gaseous): Secondary | ICD-10-CM

## 2016-07-06 DIAGNOSIS — G8929 Other chronic pain: Secondary | ICD-10-CM

## 2016-07-06 LAB — COMPREHENSIVE METABOLIC PANEL
ALT: 21 U/L (ref 0–53)
AST: 16 U/L (ref 0–37)
Albumin: 4.6 g/dL (ref 3.5–5.2)
Alkaline Phosphatase: 54 U/L (ref 39–117)
BILIRUBIN TOTAL: 0.4 mg/dL (ref 0.2–1.2)
BUN: 16 mg/dL (ref 6–23)
CALCIUM: 9.8 mg/dL (ref 8.4–10.5)
CHLORIDE: 104 meq/L (ref 96–112)
CO2: 31 meq/L (ref 19–32)
CREATININE: 0.77 mg/dL (ref 0.40–1.50)
GFR: 110.22 mL/min (ref >60.00)
GLUCOSE: 110 mg/dL — AB (ref 70–99)
Potassium: 4.1 mEq/L (ref 3.5–5.1)
SODIUM: 141 meq/L (ref 135–145)
Total Protein: 7.7 g/dL (ref 6.0–8.3)

## 2016-07-06 LAB — LIPID PANEL
CHOL/HDL RATIO: 3
CHOLESTEROL: 131 mg/dL (ref 0–200)
HDL: 44.5 mg/dL (ref >39.00)
LDL CALC: 65 mg/dL (ref 0–99)
NonHDL: 86.22
Triglycerides: 105 mg/dL (ref 0.0–149.0)
VLDL: 21 mg/dL (ref 0.0–40.0)

## 2016-07-06 LAB — HEMOGLOBIN A1C: Hgb A1c MFr Bld: 6 % (ref 4.6–6.5)

## 2016-07-06 LAB — CK: CK TOTAL: 78 U/L (ref 7–232)

## 2016-07-06 NOTE — Progress Notes (Signed)
Pre visit review using our clinic review tool, if applicable. No additional management support is needed unless otherwise documented below in the visit note. 

## 2016-07-06 NOTE — Assessment & Plan Note (Signed)
No chest pain, sob, palps Did have lightheadedness and low BP - so he decreased the metoprolol to once in the morning.  BP on higher side today  Has upcoming appt with Dr Burt Knack - will continue current dose and bring his cuff to his cardio appt cmp

## 2016-07-06 NOTE — Progress Notes (Signed)
Subjective:    Patient ID: Frank Matthews, male    DOB: 09-22-1955, 60 y.o.   MRN: [id number]  HPI The patient is here for follow up.  CAD, Hyperlipidemia, hypertension: He is taking his medication daily, but did decrease the metoprolol to 12. 5 mg once a day due to low BP.  He was lightheaded and his BP at home was 105-110/55-65.  He feels better with the decreased dose.  He has an upcoming appointment with cardiology. He is compliant with a low salt/ fat/cholesterol diet. He denies chest pain, palpitations, lower extremity edema, shortness of breath and headaches. He is exercising regularly - walking, but not as much as he would like.   Prediabetes:  He is very compliant with a low sugar/carbohydrate diet.  He is exercising regularly, but not as much as he wants due to lower back pain and lower leg pain/cramping.    Chronic lower back pain, muscle cramping:  He has chronic lower back pain and had surgery in the past.  He has had increased pain in his lower back recently without radiation pain. He also states tightness and muscle spasms in his calves that is worse with prolonged walking.  In the past he has done physical therapy which has helped. He would like to physical therapy again.  He did stop the lipitor for a short period of time at one point and the cramping did not get better.     Medications and allergies reviewed with patient and updated if appropriate.  Patient Active Problem List   Diagnosis Date Noted  . Prediabetes 11/30/2015  . Abdominal bloating 11/28/2015  . Acute right flank pain 07/15/2013  . CAD (coronary artery disease) 07/15/2013  . Other malaise and fatigue 12/04/2011  . Regurgitation 12/02/2011  . Elevated cholesterol 12/02/2011  . MICROSCOPIC HEMATURIA 11/09/2008    Current Outpatient Prescriptions on File Prior to Visit  Medication Sig Dispense Refill  . aspirin 81 MG tablet Take 1 tablet (81 mg total) by mouth daily.    Marland Kitchen atorvastatin (LIPITOR) 10 MG  tablet Take 1 tablet (10 mg total) by mouth daily at 6 PM. 90 tablet 3  . metoprolol tartrate (LOPRESSOR) 25 MG tablet Take 0.5 tablets (12.5 mg total) by mouth 2 (two) times daily. 90 tablet 3   No current facility-administered medications on file prior to visit.     Past Medical History:  Diagnosis Date  . H/O idiopathic seizure   . Mediterranean fever   . Seizures ([place]) 4-5 years   had one time seizure, none since then per pt.    Past Surgical History:  Procedure Laterality Date  . LEFT HEART CATHETERIZATION WITH CORONARY ANGIOGRAM N/A 07/23/2013   Procedure: LEFT HEART CATHETERIZATION WITH CORONARY ANGIOGRAM;  Surgeon: Blane Ohara, MD;  Location: [facility] C[facility] LAB;  Service: Cardiovascular;  Laterality: N/A;  . LUMBAR DISC SURGERY     L5-S1    Social History   Social History  . Marital status: Married    Spouse name: N/A  . Number of children: 1  . Years of education: N/A   Occupational History  . restauranteur    Social History Main Topics  . Smoking status: Never Smoker  . Smokeless tobacco: Never Used  . Alcohol use No  . Drug use: No  . Sexual activity: Not on file   Other Topics Concern  . Not on file   [place] graduate-civil engineer   Married '91  1 daughter - '96   Work - Psychiatric nurse    Family History  Problem Relation Age of Onset  . Other Father 4    deceased, accidental death  . Other Mother 49    natural causes  . Colon cancer Neg Hx   . Prostate cancer Neg Hx   . Coronary artery disease Neg Hx   . Heart attack Neg Hx   . Diabetes Brother   . Breast cancer Sister     Review of Systems  Constitutional: Negative for fever.  Respiratory: Negative for cough, shortness of breath and wheezing.   Cardiovascular: Negative for chest pain, palpitations and leg swelling.  Neurological: Positive for light-headedness (resolved by decreasing metoprolol). Negative for headaches.       Objective:   Vitals:    07/06/16 0836  BP: 140/82  Pulse: 75  Resp: 16  Temp: 97.9 F (36.6 C)   Filed Weights   07/06/16 0836  Weight: 184 lb (83.5 kg)   Body mass index is 28.82 kg/m.   Physical Exam    Constitutional: Appears well-developed and well-nourished. No distress.  HENT:  Head: Normocephalic and atraumatic.  Neck: Neck supple. No tracheal deviation present. No thyromegaly present.  No cervical lymphadenopathy Cardiovascular: Normal rate, regular rhythm and normal heart sounds.   No murmur heard. No carotid bruit .  No edema Pulmonary/Chest: Effort normal and breath sounds normal. No respiratory distress. No has no wheezes. No rales.  Skin: Skin is warm and dry. Not diaphoretic.  Psychiatric: Normal mood and affect. Behavior is normal.      Assessment & Plan:    See Problem List for Assessment and Plan of chronic medical problems.   F/u in 6 months

## 2016-07-06 NOTE — Assessment & Plan Note (Signed)
History of surgery in the past Increased lower back pain without radiculopathy Would like to do physical therapy-referred today

## 2016-07-06 NOTE — Assessment & Plan Note (Signed)
Has lost 5 lbs and has stopped all sugar Exercising some Check a1c

## 2016-07-06 NOTE — Assessment & Plan Note (Addendum)
Calf tightening and cramping especially with walking too much unlikely related to statin - no improvement when stopped it briefly - will check CK Refer to PT - may be related to back pain

## 2016-07-06 NOTE — Assessment & Plan Note (Signed)
Improved with diet changes - decreased wheat

## 2016-07-06 NOTE — Patient Instructions (Addendum)
  Test(s) ordered today. Your results will be released to [place] (or called to you) after review, usually within 72hours after test completion. If any changes need to be made, you will be notified at that same time.  All other Health Maintenance issues reviewed.   All recommended immunizations and age-appropriate screenings are up-to-date or discussed.  Flu vaccine administered today.   Medications reviewed and updated.   No changes recommended at this time.  A referral has been ordered for physical therapy  Please followup in 6 months

## 2016-07-06 NOTE — Assessment & Plan Note (Signed)
Check lipid, cmp Continue statin

## 2016-07-08 ENCOUNTER — Encounter: Payer: Self-pay | Admitting: Cardiovascular Disease

## 2016-07-15 DIAGNOSIS — R252 Cramp and spasm: Secondary | ICD-10-CM | POA: Diagnosis not present

## 2016-07-15 DIAGNOSIS — M545 Low back pain: Secondary | ICD-10-CM | POA: Diagnosis not present

## 2016-07-15 DIAGNOSIS — G8929 Other chronic pain: Secondary | ICD-10-CM | POA: Diagnosis not present

## 2016-07-16 DIAGNOSIS — G8929 Other chronic pain: Secondary | ICD-10-CM | POA: Diagnosis not present

## 2016-07-16 DIAGNOSIS — R252 Cramp and spasm: Secondary | ICD-10-CM | POA: Diagnosis not present

## 2016-07-16 DIAGNOSIS — M545 Low back pain: Secondary | ICD-10-CM | POA: Diagnosis not present

## 2016-07-19 ENCOUNTER — Other Ambulatory Visit: Payer: BLUE CROSS/BLUE SHIELD | Admitting: *Deleted

## 2016-07-19 DIAGNOSIS — I2510 Atherosclerotic heart disease of native coronary artery without angina pectoris: Secondary | ICD-10-CM | POA: Diagnosis not present

## 2016-07-19 LAB — COMPREHENSIVE METABOLIC PANEL
ALT: 33 U/L (ref 9–46)
AST: 21 U/L (ref 10–35)
Albumin: 4.3 g/dL (ref 3.6–5.1)
Alkaline Phosphatase: 47 U/L (ref 40–115)
BUN: 15 mg/dL (ref 7–25)
CHLORIDE: 103 mmol/L (ref 98–110)
CO2: 26 mmol/L (ref 20–31)
CREATININE: 0.76 mg/dL (ref 0.70–1.33)
Calcium: 9.5 mg/dL (ref 8.6–10.3)
Glucose, Bld: 95 mg/dL (ref 65–99)
Potassium: 4.2 mmol/L (ref 3.5–5.3)
Sodium: 140 mmol/L (ref 135–146)
Total Bilirubin: 0.5 mg/dL (ref 0.2–1.2)
Total Protein: 7.1 g/dL (ref 6.1–8.1)

## 2016-07-19 LAB — LIPID PANEL
CHOLESTEROL: 137 mg/dL (ref <200)
HDL: 42 mg/dL (ref >40)
LDL Cholesterol: 73 mg/dL
TRIGLYCERIDES: 108 mg/dL (ref <150)
Total CHOL/HDL Ratio: 3.3 Ratio (ref <5.0)
VLDL: 22 mg/dL (ref <30)

## 2016-07-21 DIAGNOSIS — R252 Cramp and spasm: Secondary | ICD-10-CM | POA: Diagnosis not present

## 2016-07-21 DIAGNOSIS — G8929 Other chronic pain: Secondary | ICD-10-CM | POA: Diagnosis not present

## 2016-07-21 DIAGNOSIS — M545 Low back pain: Secondary | ICD-10-CM | POA: Diagnosis not present

## 2016-07-23 ENCOUNTER — Ambulatory Visit (INDEPENDENT_AMBULATORY_CARE_PROVIDER_SITE_OTHER): Payer: BLUE CROSS/BLUE SHIELD | Admitting: Cardiovascular Disease

## 2016-07-23 ENCOUNTER — Other Ambulatory Visit: Payer: Self-pay | Admitting: Cardiovascular Disease

## 2016-07-23 ENCOUNTER — Encounter: Payer: Self-pay | Admitting: Cardiovascular Disease

## 2016-07-23 VITALS — BP 146/72 | HR 72 | Ht 66.0 in | Wt 188.0 lb

## 2016-07-23 DIAGNOSIS — I6509 Occlusion and stenosis of unspecified vertebral artery: Secondary | ICD-10-CM

## 2016-07-23 DIAGNOSIS — E785 Hyperlipidemia, unspecified: Secondary | ICD-10-CM

## 2016-07-23 DIAGNOSIS — I2510 Atherosclerotic heart disease of native coronary artery without angina pectoris: Secondary | ICD-10-CM | POA: Diagnosis not present

## 2016-07-23 DIAGNOSIS — R252 Cramp and spasm: Secondary | ICD-10-CM | POA: Diagnosis not present

## 2016-07-23 DIAGNOSIS — G8929 Other chronic pain: Secondary | ICD-10-CM | POA: Diagnosis not present

## 2016-07-23 DIAGNOSIS — M545 Low back pain: Secondary | ICD-10-CM | POA: Diagnosis not present

## 2016-07-23 MED ORDER — METOPROLOL SUCCINATE ER 25 MG PO TB24: 12.5000 mg | ORAL_TABLET | Freq: Every day | ORAL | 3 refills | Status: DC

## 2016-07-23 NOTE — Progress Notes (Signed)
Cardiology Office Note Date:  07/23/2016   ID:  ELEUTERIO YORI, DOB Feb 20, 1956, MRN [id number]  PCP:  Binnie Rail, MD  Cardiologist:  Sherren Mocha, MD    Chief Complaint  Patient presents with  . Coronary Artery Disease     History of Present Illness: Frank Matthews is a 60 y.o. male who presents for follow-up of coronary artery disease. He initially presented in 2014 with typical symptoms of exertional angina. He underwent cardiac catheterization demonstrating severe stenosis of a diagonal branch and total occlusion of a small diagonal subbranch. He had no coronary obstruction in his major coronary branches. Medical therapy was recommended. He was started on a beta blocker at that time.  He is doing well. He has changed his diet but hasn't lost weight - feels like his portions are too much. He's exercising 2-3 days per week, walking for exercise. Having some back issues and doing physical therapy. Today, he denies symptoms of palpitations, chest pain, shortness of breath, orthopnea, PND, lower extremity edema,  or syncope. He has had occasional dizziness and thinks his BP has been running low - brings in home readings today. Sometimes skips his second dose of metoprolol.  Past Medical History:  Diagnosis Date  . H/O idiopathic seizure   . Mediterranean fever   . Seizures ([place]) 4-5 years   had one time seizure, none since then per pt.    Past Surgical History:  Procedure Laterality Date  . LEFT HEART CATHETERIZATION WITH CORONARY ANGIOGRAM N/A 07/23/2013   Procedure: LEFT HEART CATHETERIZATION WITH CORONARY ANGIOGRAM;  Surgeon: Blane Ohara, MD;  Location[facility] LAB;  Service: Cardiovascular;  Laterality: N/A;  . LUMBAR DISC SURGERY     L5-S1    Current Outpatient Prescriptions  Medication Sig Dispense Refill  . aspirin 81 MG tablet Take 1 tablet (81 mg total) by mouth daily.    Marland Kitchen atorvastatin (LIPITOR) 10 MG tablet TAKE 1 TABLET (10 MG TOTAL) BY MOUTH DAILY AT 6  PM. 90 tablet 3  . metoprolol succinate (TOPROL XL) 25 MG 24 hr tablet Take 0.5 tablets (12.5 mg total) by mouth at bedtime. 45 tablet 3   No current facility-administered medications for this visit.     Allergies:   Patient has no known allergies.   Social History:  The patient  reports that he has never smoked. He has never used smokeless tobacco. He reports that he does not drink alcohol or use drugs.   Family History:  The patient's  family history includes Breast cancer in his sister; Diabetes in his brother; Other (age of onset: 40) in his father; Other (age of onset: 78) in his mother.    ROS:  Please see the history of present illness.  Otherwise, review of systems is positive for occasional neck pains.  All other systems are reviewed and negative.    PHYSICAL EXAM: VS:  BP (!) 146/72   Pulse 72   Ht 5\' 6"  (1.676 m)   Wt 85.3 kg (188 lb)   BMI 30.34 kg/m  , BMI Body mass index is 30.34 kg/m. GEN: Well nourished, well developed, in no acute distress  HEENT: normal  Neck: no JVD, no masses. No carotid bruits Cardiac: RRR without murmur or gallop                Respiratory:  clear to auscultation bilaterally, normal work of breathing GI: soft, nontender, nondistended, + BS MS: no deformity or atrophy  Ext: no  pretibial edema, pedal pulses 2+= bilaterally Skin: warm and dry, no rash Neuro:  Strength and sensation are intact Psych: euthymic mood, full affect  EKG:  EKG is ordered today. The ekg ordered today shows NSR 72 bpm, incomplete RBBB, no ST-T changes.  Recent Labs: 11/28/2015: Hemoglobin 14.8; Platelets 259.0; TSH 2.18 07/19/2016: ALT 33; BUN 15; Creat 0.76; Potassium 4.2; Sodium 140   Lipid Panel     Component Value Date/Time   CHOL 137 07/19/2016 1125   TRIG 108 07/19/2016 1125   HDL 42 07/19/2016 1125   CHOLHDL 3.3 07/19/2016 1125   VLDL 22 07/19/2016 1125   LDLCALC 73 07/19/2016 1125   LDLDIRECT 139.2 07/12/2013 1639      Wt Readings from Last 3  Encounters:  07/23/16 85.3 kg (188 lb)  07/06/16 83.5 kg (184 lb)  01/21/16 84.4 kg (186 lb)     Cardiac Studies Reviewed: Cardiac catheterization 07/23/2013: Procedural Findings:  Hemodynamics:  AO 127/79 with a mean of 102  LV 129/14  Coronary angiography:  Coronary dominance: right  Left mainstem: The left mainstem arises from the left coronary cusp. The vessel is widely patent with no obstructive disease.  Left anterior descending (LAD): The LAD is normal in caliber. The vessel wraps around the left ventricular apex. The LAD has minor irregularity but no significant stenosis throughout its distribution to the apex. However, the first diagonal has a 90% stenosis in its midportion. There is a subbranch of the diagonal is totally occluded and fills from left to left collaterals. The diagonal is small to medium in caliber.  Left circumflex (LCx): The left circumflex supplies a large intermediate branch with 20-the vessel is medium in caliber. There is 30% mid vessel stenosis. The distal vessel is patent. The PDA is patent. 30% ostial stenosis. The AV circumflex gives off 2 obtuse marginal branches without significant disease.  Right coronary artery (RCA): The RCA is a dominant vessel.  Left ventriculography: Left ventricular systolic function is normal, LVEF is estimated at 55-65%, there is no significant mitral regurgitation  Final Conclusions:  1. Severe stenosis of the first diagonal branch of the LAD with total occlusion of a subbranch of the diagonal.  2. Nonobstructive stenosis of the LAD, left circumflex, and right coronary arteries  3. Normal left ventricular function.  Recommendations: I would recommend medical therapy for this patient's coronary artery disease. His diagonal branch is small in caliber and it is best suited for medical treatment. Will start him on metoprolol 12.5 mg twice daily. Consider the addition of a long-acting nitrate depending on his response to  the beta blocker.  ASSESSMENT AND PLAN: 1.  CAD, native vessel, without symptoms of angina: pt doing well without angina. His home BP's are ranging 99-120/61-71. Feels dizzy at times. Will change from metoprolol tartrate to metoprolol succinate 12.5 mg at bedtime to reduce his dose by 50%. If persistent sx's will discontinue.   2. Hyperlipidemia: lipids at goal as above. Continue atorvastatin.   3. Remote syncope with hx of vertebral artery stenosis: MRA from 2006 reviewed. Pt with dominant vertebral with mild stenosis. Also with a 'kink' in the carotid with mild associated stenosis. Will order a carotid duplex to re-evaluate.   Current medicines are reviewed with the patient today.  The patient does not have concerns regarding medicines.  Labs/ tests ordered today include:   Orders Placed This Encounter  Procedures  . EKG 12-Lead    Disposition:   FU one year  Signed, Sherren Mocha, MD  07/23/2016 5:05 PM    [facility] [address], [place], [place]  [address] Phone: [phone]; Fax: [phone]

## 2016-07-23 NOTE — Patient Instructions (Signed)
Medication Instructions:  CHANGE to Metoprolol Succinate (Toprol) 12.5 mg at bedtime   Labwork: None Ordered   Testing/Procedures: Your physician has requested that you have a carotid duplex. This test is an ultrasound of the carotid arteries in your neck. It looks at blood flow through these arteries that supply the brain with blood. Allow one hour for this exam. There are no restrictions or special instructions.    Follow-Up: Your physician wants you to follow-up in: 1 year with Dr. Burt Knack.  You will receive a reminder letter in the mail two months in advance. If you don't receive a letter, please call our office to schedule the follow-up appointment.   If you need a refill on your cardiac medications before your next appointment, please call your pharmacy.   Thank you for choosing [facility]! Christen Bame, RN [phone]

## 2016-07-23 NOTE — Telephone Encounter (Signed)
Will file in chart as: metoprolol tartrate (LOPRESSOR) 25 MG tablet The source prescription was discontinued on 07/23/2016 by Emmaline Life, RN for the following reason: Change in therapy.

## 2016-07-27 DIAGNOSIS — M545 Low back pain: Secondary | ICD-10-CM | POA: Diagnosis not present

## 2016-07-27 DIAGNOSIS — R252 Cramp and spasm: Secondary | ICD-10-CM | POA: Diagnosis not present

## 2016-07-27 DIAGNOSIS — G8929 Other chronic pain: Secondary | ICD-10-CM | POA: Diagnosis not present

## 2016-07-28 DIAGNOSIS — G8929 Other chronic pain: Secondary | ICD-10-CM | POA: Diagnosis not present

## 2016-07-28 DIAGNOSIS — M545 Low back pain: Secondary | ICD-10-CM | POA: Diagnosis not present

## 2016-07-28 DIAGNOSIS — R252 Cramp and spasm: Secondary | ICD-10-CM | POA: Diagnosis not present

## 2016-08-16 ENCOUNTER — Ambulatory Visit (HOSPITAL_COMMUNITY)
Admission: RE | Admit: 2016-08-16 | Discharge: 2016-08-16 | Disposition: A | Discharge status: Home / Self Care | Payer: BLUE CROSS/BLUE SHIELD | Source: Ambulatory Visit | Attending: Cardiology | Admitting: Cardiology

## 2016-08-16 DIAGNOSIS — I6509 Occlusion and stenosis of unspecified vertebral artery: Secondary | ICD-10-CM | POA: Insufficient documentation

## 2016-08-16 DIAGNOSIS — I2510 Atherosclerotic heart disease of native coronary artery without angina pectoris: Secondary | ICD-10-CM | POA: Diagnosis not present

## 2016-11-24 ENCOUNTER — Encounter: Payer: Self-pay | Admitting: Internal Medicine

## 2016-11-24 ENCOUNTER — Ambulatory Visit (INDEPENDENT_AMBULATORY_CARE_PROVIDER_SITE_OTHER): Payer: BLUE CROSS/BLUE SHIELD | Admitting: Internal Medicine

## 2016-11-24 VITALS — BP 120/74 | HR 77 | Temp 98.4°F | Resp 16 | Ht 66.0 in | Wt 187.0 lb

## 2016-11-24 DIAGNOSIS — M545 Low back pain, unspecified: Secondary | ICD-10-CM

## 2016-11-24 DIAGNOSIS — Z0000 Encounter for general adult medical examination without abnormal findings: Secondary | ICD-10-CM | POA: Diagnosis not present

## 2016-11-24 DIAGNOSIS — E7800 Pure hypercholesterolemia, unspecified: Secondary | ICD-10-CM | POA: Diagnosis not present

## 2016-11-24 DIAGNOSIS — E785 Hyperlipidemia, unspecified: Secondary | ICD-10-CM

## 2016-11-24 DIAGNOSIS — G8929 Other chronic pain: Secondary | ICD-10-CM

## 2016-11-24 DIAGNOSIS — I2510 Atherosclerotic heart disease of native coronary artery without angina pectoris: Secondary | ICD-10-CM | POA: Diagnosis not present

## 2016-11-24 DIAGNOSIS — R7303 Prediabetes: Secondary | ICD-10-CM

## 2016-11-24 DIAGNOSIS — R252 Cramp and spasm: Secondary | ICD-10-CM | POA: Diagnosis not present

## 2016-11-24 MED ORDER — METOPROLOL SUCCINATE ER 25 MG PO TB24: 12.5000 mg | ORAL_TABLET | Freq: Every day | ORAL | 3 refills | Status: DC

## 2016-11-24 MED ORDER — ATORVASTATIN CALCIUM 10 MG PO TABS
10.0000 mg | ORAL_TABLET | Freq: Every day | ORAL | 3 refills | Status: DC
Start: 2016-11-24 — End: 2018-02-08

## 2016-11-24 NOTE — Assessment & Plan Note (Signed)
Intermittent with walking too much or at night Will refer to Dr Tamala Julian for further evaluation Check labs No improvement with PT

## 2016-11-24 NOTE — Patient Instructions (Addendum)
Test(s) ordered today. Your results will be released to [place] (or called to you) after review, usually within 72hours after test completion. If any changes need to be made, you will be notified at that same time.  All other Health Maintenance issues reviewed.   All recommended immunizations and age-appropriate screenings are up-to-date or discussed.  No immunizations administered today.   Medications reviewed and updated.  No changes recommended at this time.  Your prescription(s) have been submitted to your pharmacy. Please take as directed and contact our office if you believe you are having problem(s) with the medication(s).  A referral was ordered for Dr Tamala Julian - sports medicine.   Please followup in one  year for a physical    Health Maintenance, Male A healthy lifestyle and preventive care is important for your health and wellness. Ask your health care provider about what schedule of regular examinations is right for you. What should I know about weight and diet?  Eat a Healthy Diet  Eat plenty of vegetables, fruits, whole grains, low-fat dairy products, and lean protein.  Do not eat a lot of foods high in solid fats, added sugars, or salt. Maintain a Healthy Weight  Regular exercise can help you achieve or maintain a healthy weight. You should:  Do at least 150 minutes of exercise each week. The exercise should increase your heart rate and make you sweat (moderate-intensity exercise).  Do strength-training exercises at least twice a week. Watch Your Levels of Cholesterol and Blood Lipids  Have your blood tested for lipids and cholesterol every 5 years starting at 59 years of age. If you are at high risk for heart disease, you should start having your blood tested when you are 59 years old. You may need to have your cholesterol levels checked more often if:  Your lipid or cholesterol levels are high.  You are older than 59 years of age.  You are at high risk for heart  disease. What should I know about cancer screening? Many types of cancers can be detected early and may often be prevented. Lung Cancer  You should be screened every year for lung cancer if:  You are a current smoker who has smoked for at least 30 years.  You are a former smoker who has quit within the past 15 years.  Talk to your health care provider about your screening options, when you should start screening, and how often you should be screened. Colorectal Cancer  Routine colorectal cancer screening usually begins at 59 years of age and should be repeated every 5-10 years until you are 59 years old. You may need to be screened more often if early forms of precancerous polyps or small growths are found. Your health care provider may recommend screening at an earlier age if you have risk factors for colon cancer.  Your health care provider may recommend using home test kits to check for hidden blood in the stool.  A small camera at the end of a tube can be used to examine your colon (sigmoidoscopy or colonoscopy). This checks for the earliest forms of colorectal cancer. Prostate and Testicular Cancer  Depending on your age and overall health, your health care provider may do certain tests to screen for prostate and testicular cancer.  Talk to your health care provider about any symptoms or concerns you have about testicular or prostate cancer. Skin Cancer  Check your skin from head to toe regularly.  Tell your health care provider about any new  moles or changes in moles, especially if:  There is a change in a mole's size, shape, or color.  You have a mole that is larger than a pencil eraser.  Always use sunscreen. Apply sunscreen liberally and repeat throughout the day.  Protect yourself by wearing long sleeves, pants, a wide-brimmed hat, and sunglasses when outside. What should I know about heart disease, diabetes, and high blood pressure?  If you are 18-49 years of age,  have your blood pressure checked every 3-5 years. If you are 30 years of age or older, have your blood pressure checked every year. You should have your blood pressure measured twice-once when you are at a hospital or clinic, and once when you are not at a hospital or clinic. Record the average of the two measurements. To check your blood pressure when you are not at a hospital or clinic, you can use:  An automated blood pressure machine at a pharmacy.  A home blood pressure monitor.  Talk to your health care provider about your target blood pressure.  If you are between 20-86 years old, ask your health care provider if you should take aspirin to prevent heart disease.  Have regular diabetes screenings by checking your fasting blood sugar level.  If you are at a normal weight and have a low risk for diabetes, have this test once every three years after the age of 50.  If you are overweight and have a high risk for diabetes, consider being tested at a younger age or more often.  A one-time screening for abdominal aortic aneurysm (AAA) by ultrasound is recommended for men aged 74-75 years who are current or former smokers. What should I know about preventing infection? Hepatitis B  If you have a higher risk for hepatitis B, you should be screened for this virus. Talk with your health care provider to find out if you are at risk for hepatitis B infection. Hepatitis C  Blood testing is recommended for:  Everyone born from [birth year] through 1963.  Anyone with known risk factors for hepatitis C. Sexually Transmitted Diseases (STDs)  You should be screened each year for STDs including gonorrhea and chlamydia if:  You are sexually active and are younger than 59 years of age.  You are older than 59 years of age and your health care provider tells you that you are at risk for this type of infection.  Your sexual activity has changed since you were last screened and you are at an increased risk for  chlamydia or gonorrhea. Ask your health care provider if you are at risk.  Talk with your health care provider about whether you are at high risk of being infected with HIV. Your health care provider may recommend a prescription medicine to help prevent HIV infection. What else can I do?  Schedule regular health, dental, and eye exams.  Stay current with your vaccines (immunizations).  Do not use any tobacco products, such as cigarettes, chewing tobacco, and e-cigarettes. If you need help quitting, ask your health care provider.  Limit alcohol intake to no more than 2 drinks per day. One drink equals 12 ounces of beer, 5 ounces of wine, or 1 ounces of hard liquor.  Do not use street drugs.  Do not share needles.  Ask your health care provider for help if you need support or information about quitting drugs.  Tell your health care provider if you often feel depressed.  Tell your health care provider if you have  ever been abused or do not feel safe at home. This information is not intended to replace advice given to you by your health care provider. Make sure you discuss any questions you have with your health care provider. Document Released: 02/26/2008 Document Revised: 04/28/2016 Document Reviewed: 06/03/2015 Elsevier Interactive Patient Education  2017 [employer].

## 2016-11-24 NOTE — Assessment & Plan Note (Signed)
Check a1c Low sugar / carb diet Stressed regular exercise, weight loss  

## 2016-11-24 NOTE — Assessment & Plan Note (Signed)
No chest pain, palpitations, palps,  Following with cardiology Continue current medications Continue regular exercise Working on weight loss

## 2016-11-24 NOTE — Progress Notes (Signed)
Subjective:    Patient ID: Frank Matthews, male    DOB: Dec 21, 1955, 61 y.o.   MRN: [id number]  HPI He is here for a physical exam.   Chronic lower back pain:  He has some back pain that has intermittent.  He has had it for 10 years.  He can not stay in one position for a long period of time.   Cramping in calves:  He has cramping in his calves when he walks a lot and sometimes at night.  The muscles feel weak - don't have the energy.   He has been walking for exercise. He has cut down on his sugars and is eating a healthy diet.   Medications and allergies reviewed with patient and updated if appropriate.  Patient Active Problem List   Diagnosis Date Noted  . Chronic lower back pain 07/06/2016  . Muscle cramping 07/06/2016  . Prediabetes 11/30/2015  . Abdominal bloating 11/28/2015  . Acute right flank pain 07/15/2013  . CAD (coronary artery disease) 07/15/2013  . Other malaise and fatigue 12/04/2011  . Regurgitation 12/02/2011  . Elevated cholesterol 12/02/2011  . MICROSCOPIC HEMATURIA 11/09/2008    Current Outpatient Prescriptions on File Prior to Visit  Medication Sig Dispense Refill  . aspirin 81 MG tablet Take 1 tablet (81 mg total) by mouth daily.    Marland Kitchen atorvastatin (LIPITOR) 10 MG tablet TAKE 1 TABLET (10 MG TOTAL) BY MOUTH DAILY AT 6 PM. 90 tablet 3  . metoprolol succinate (TOPROL XL) 25 MG 24 hr tablet Take 0.5 tablets (12.5 mg total) by mouth at bedtime. 45 tablet 3   No current facility-administered medications on file prior to visit.     Past Medical History:  Diagnosis Date  . H/O idiopathic seizure   . Mediterranean fever   . Seizures ([place]) 4-5 years   had one time seizure, none since then per pt.    Past Surgical History:  Procedure Laterality Date  . LEFT HEART CATHETERIZATION WITH CORONARY ANGIOGRAM N/A 07/23/2013   Procedure: LEFT HEART CATHETERIZATION WITH CORONARY ANGIOGRAM;  Surgeon: Blane Ohara, MD;  Location: [facility] CATH LAB;  Service:  Cardiovascular;  Laterality: N/A;  . LUMBAR DISC SURGERY     L5-S1    Social History   Social History  . Marital status: Married    Spouse name: N/A  . Number of children: 1  . Years of education: N/A   Occupational History  . restauranteur    Social History Main Topics  . Smoking status: Never Smoker  . Smokeless tobacco: Never Used  . Alcohol use No  . Drug use: No  . Sexual activity: Not on file   Other Topics Concern  . 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 Other Father 84    deceased, accidental death  . Other Mother 108    natural causes  . Diabetes Brother   . Breast cancer Sister   . Colon cancer Neg Hx   . Prostate cancer Neg Hx   . Coronary artery disease Neg Hx   . Heart attack Neg Hx     Review of Systems  Constitutional: Positive for fatigue (intermittent). Negative for appetite change, chills, fever and unexpected weight change.  Eyes: Negative for visual disturbance.  Respiratory: Negative for cough, shortness  of breath and wheezing.   Cardiovascular: Positive for palpitations (occasionally) and leg swelling (mild, intermittent). Negative for chest pain.  Gastrointestinal: Negative for abdominal pain, blood in stool, constipation, diarrhea and nausea.       No gerd  Genitourinary: Negative for difficulty urinating, dysuria and hematuria.  Musculoskeletal: Positive for back pain and myalgias (calf cramping).  Skin: Positive for color change (new moles) and rash (right hand - recurring).  Neurological: Negative for dizziness, light-headedness and headaches.  Psychiatric/Behavioral: Negative for dysphoric mood. The patient is not nervous/anxious.        Objective:   Vitals:   11/24/16 1408  BP: 120/74  Pulse: 77  Resp: 16  Temp: 98.4 F (36.9 C)   Filed Weights   11/24/16 1408  Weight: 187  lb (84.8 kg)   Body mass index is 30.18 kg/m.  Wt Readings from Last 3 Encounters:  11/24/16 187 lb (84.8 kg)  07/23/16 188 lb (85.3 kg)  07/06/16 184 lb (83.5 kg)     Physical Exam Constitutional: He appears well-developed and well-nourished. No distress.  HENT:  Head: Normocephalic and atraumatic.  Right Ear: External ear normal.  Left Ear: External ear normal.  Mouth/Throat: Oropharynx is clear and moist.  Normal ear canals and TM b/l  Eyes: Conjunctivae and EOM are normal.  Neck: Neck supple. No tracheal deviation present. No thyromegaly present.  No carotid bruit  Cardiovascular: Normal rate, regular rhythm, normal heart sounds and intact distal pulses.   No murmur heard. Pulmonary/Chest: Effort normal and breath sounds normal. No respiratory distress. He has no wheezes. He has no rales.  Abdominal: Soft. Bowel sounds are normal. He exhibits no distension. There is no tenderness.  Genitourinary: deferred  - check psa Musculoskeletal: He exhibits no edema.  Lymphadenopathy:    He has no cervical adenopathy.  Skin: Skin is warm and dry. He is not diaphoretic. small rash on right posterior hand, skin tags on neck Psychiatric: He has a normal mood and affect. His behavior is normal.           Assessment & Plan:   Physical exam: Screening blood work   ordered Immunizations   Up to date  Colonoscopy   Up to date  Eye exams  Up to date  EKG  - done by cardiology Exercise - walking Weight - wants to lose weight Skin  - rash on hand (responsive to steroid cream) and skin tags -- will see derm Substance abuse  none  See Problem List for Assessment and Plan of chronic medical problems.   FU in one year for a CPE

## 2016-11-24 NOTE — Progress Notes (Signed)
Pre visit review using our clinic review tool, if applicable. No additional management support is needed unless otherwise documented below in the visit note. 

## 2016-11-24 NOTE — Assessment & Plan Note (Signed)
Check lipid panel  Continue daily statin Regular exercise and healthy diet encouraged  

## 2016-11-24 NOTE — Assessment & Plan Note (Signed)
Chronic intermittent No improvement with PT Will refer to Dr Tamala Julian

## 2016-11-25 ENCOUNTER — Other Ambulatory Visit: Payer: Self-pay | Admitting: Internal Medicine

## 2016-11-25 ENCOUNTER — Other Ambulatory Visit (INDEPENDENT_AMBULATORY_CARE_PROVIDER_SITE_OTHER): Payer: BLUE CROSS/BLUE SHIELD

## 2016-11-25 DIAGNOSIS — R252 Cramp and spasm: Secondary | ICD-10-CM | POA: Diagnosis not present

## 2016-11-25 DIAGNOSIS — R7303 Prediabetes: Secondary | ICD-10-CM

## 2016-11-25 DIAGNOSIS — E785 Hyperlipidemia, unspecified: Secondary | ICD-10-CM

## 2016-11-25 DIAGNOSIS — Z0000 Encounter for general adult medical examination without abnormal findings: Secondary | ICD-10-CM | POA: Diagnosis not present

## 2016-11-25 LAB — COMPREHENSIVE METABOLIC PANEL
ALBUMIN: 4.4 g/dL (ref 3.5–5.2)
ALT: 25 U/L (ref 0–53)
AST: 18 U/L (ref 0–37)
Alkaline Phosphatase: 54 U/L (ref 39–117)
BILIRUBIN TOTAL: 0.3 mg/dL (ref 0.2–1.2)
BUN: 12 mg/dL (ref 6–23)
CO2: 29 mEq/L (ref 19–32)
CREATININE: 0.84 mg/dL (ref 0.40–1.50)
Calcium: 10.2 mg/dL (ref 8.4–10.5)
Chloride: 104 mEq/L (ref 96–112)
GFR: 99.55 mL/min (ref >60.00)
Glucose, Bld: 99 mg/dL (ref 70–99)
POTASSIUM: 4.7 meq/L (ref 3.5–5.1)
SODIUM: 143 meq/L (ref 135–145)
Total Protein: 7.5 g/dL (ref 6.0–8.3)

## 2016-11-25 LAB — CBC WITH DIFFERENTIAL/PLATELET
Basophils Absolute: 0.1 10*3/uL (ref 0.0–0.1)
Basophils Relative: 1 % (ref 0.0–3.0)
EOS ABS: 0.3 10*3/uL (ref 0.0–0.7)
Eosinophils Relative: 4.2 % (ref 0.0–5.0)
HEMATOCRIT: 42.4 % (ref 39.0–52.0)
HEMOGLOBIN: 14.2 g/dL (ref 13.0–17.0)
LYMPHS PCT: 32.8 % (ref 12.0–46.0)
Lymphs Abs: 2.4 10*3/uL (ref 0.7–4.0)
MCHC: 33.6 g/dL (ref 30.0–36.0)
MCV: 86 fl (ref 78.0–100.0)
Monocytes Absolute: 0.6 10*3/uL (ref 0.1–1.0)
Monocytes Relative: 7.6 % (ref 3.0–12.0)
NEUTROS ABS: 4 10*3/uL (ref 1.4–7.7)
Neutrophils Relative %: 54.4 % (ref 43.0–77.0)
PLATELETS: 211 10*3/uL (ref 150.0–400.0)
RBC: 4.93 Mil/uL (ref 4.22–5.81)
RDW: 13.8 % (ref 11.5–15.5)
WBC: 7.4 10*3/uL (ref 4.0–10.5)

## 2016-11-25 LAB — LIPID PANEL
CHOLESTEROL: 114 mg/dL (ref 0–200)
HDL: 36.5 mg/dL — ABNORMAL LOW (ref >39.00)
LDL CALC: 44 mg/dL (ref 0–99)
NonHDL: 77.5
Total CHOL/HDL Ratio: 3
Triglycerides: 166 mg/dL — ABNORMAL HIGH (ref 0.0–149.0)
VLDL: 33.2 mg/dL (ref 0.0–40.0)

## 2016-11-25 LAB — VITAMIN D 25 HYDROXY (VIT D DEFICIENCY, FRACTURES): VITD: 23.71 ng/mL — AB (ref 30.00–100.00)

## 2016-11-25 LAB — HEMOGLOBIN A1C: Hgb A1c MFr Bld: 6.1 % (ref 4.6–6.5)

## 2016-11-25 LAB — MAGNESIUM: MAGNESIUM: 2.2 mg/dL (ref 1.5–2.5)

## 2016-11-25 LAB — TSH: TSH: 3.69 u[IU]/mL (ref 0.35–4.50)

## 2016-11-29 LAB — PSA, TOTAL AND FREE
PSA, % Free: 50 % (ref >25)
PSA, FREE: 0.1 ng/mL
PSA, Total: 0.2 ng/mL (ref <=4.0)

## 2016-12-04 ENCOUNTER — Encounter: Payer: Self-pay | Admitting: Internal Medicine

## 2016-12-08 NOTE — Progress Notes (Signed)
[facility] [address] [place], [place] [address] Phone: [phone] Subjective:    I'm seeing this patient by the request  of:  Binnie Rail, MD   CC: Back and leg pain f/u   MPN:[id number]  Frank Matthews is a 61 y.o. male coming in with complaint of back and leg pain. Past medical history significant for seizures. Patient states that he has had low back pain has been going on for multiple years if not decades. Patient states that it seems to have to change positions regularly otherwise he has increasing pain. Seems to have the pain though more intermittently. Denies any significant radiation but does notice a significant cramping mostly in the cavus bilaterally. Seems to have been more at night as well as when he is walking a significant amount. Sometimes feels like it they are week and have no energy.Patient states that he sits down for some time and seems to get better and then when he started walking again and may begin worse again.     Past Medical History:  Diagnosis Date  . H/O idiopathic seizure   . Mediterranean fever   . Seizures ([place]) 4-5 years   had one time seizure, none since then per pt.   Past Surgical History:  Procedure Laterality Date  . LEFT HEART CATHETERIZATION WITH CORONARY ANGIOGRAM N/A 07/23/2013   Procedure: LEFT HEART CATHETERIZATION WITH CORONARY ANGIOGRAM;  Surgeon: Blane Ohara, MD;  Location: [facility] CATH LAB;  Service: Cardiovascular;  Laterality: N/A;  . LUMBAR DISC SURGERY     L5-S1   Social History   Social History  . Marital status: Married    Spouse name: N/A  . Number of children: 1  . Years of education: N/A   Occupational History  . restauranteur    Social History Main Topics  . Smoking status: Never Smoker  . Smokeless tobacco: Never Used  . Alcohol use No  . Drug use: No  . Sexual activity: Not Asked   Other Topics Concern  . None   Social History Narrative   Investment banker, operational   Married '91   1 daughter - '96   Work - Psychiatric nurse   No Known Allergies Family History  Problem Relation Age of Onset  . Other Father 62    deceased, accidental death  . Other Mother 30    natural causes  . Diabetes Brother   . Breast cancer Sister   . Colon cancer Neg Hx   . Prostate cancer Neg Hx   . Coronary artery disease Neg Hx   . Heart attack Neg Hx     Past medical history, social, surgical and family history all reviewed in electronic medical record.  No pertanent information unless stated regarding to the chief complaint.   Review of Systems:Review of systems updated and as accurate as of 12/09/16  No headache, visual changes, nausea, vomiting, diarrhea, constipation, dizziness, abdominal pain, skin rash, fevers, chills, night sweats, weight loss, swollen lymph nodes, body aches, joint swelling, muscle aches, chest pain, shortness of breath, mood changes.   Objective  Blood pressure 110/60, pulse 74, resp. rate 16, height 5\' 6"  (1.676 m), weight 186 lb 6 oz (84.5 kg), SpO2 96 %. Systems examined below as of 12/09/16   General: No apparent distress alert and oriented x3 mood and affect normal, dressed appropriately.  HEENT: Pupils equal, extraocular movements intact  Respiratory: Patient's speak in full sentences and does not appear short  of breath  Cardiovascular: No lower extremity edema, non tender, no erythema  Skin: Warm dry intact with no signs of infection or rash on extremities or on axial skeleton.  Abdomen: Soft nontender  Neuro: Cranial nerves II through XII are intact, neurovascularly intact in all extremities with 2+ DTRs and 2+ pulses.  Lymph: No lymphadenopathy of posterior or anterior cervical chain or axillae bilaterally.  Gait normal with good balance and coordination.  MSK:  Non tender with full range of motion and good stability and symmetric strength and tone of shoulders, elbows, wrist, hip, knee and ankles bilaterally.  Back Exam:    Inspection: Unremarkable  Motion: Flexion 45 deg, Extension 25 deg, Side Bending to 45 deg bilaterally,  Rotation to 35 deg bilaterally  SLR laying: Tightness bilaterally XSLR laying: Negative  Palpable tenderness: Mild tenderness to palpation of the paraspinal musculature lumbar spine right greater left.Marland Kitchen FABER: Tightness bilaterally. Sensory change: Gross sensation intact to all lumbar and sacral dermatomes.  Reflexes: 2+ at both patellar tendons, 2+ at achilles tendons, Babinski's downgoing.  Strength at foot  4-5 strength on the left leg compared to the contralateral side.  Procedure note 61950; 15 minutes spent for Therapeutic exercises as stated in above notes.  This included exercises focusing on stretching, strengthening, with significant focus on eccentric aspects. Low back exercises that included:  Pelvic tilt/bracing instruction to focus on control of the pelvic girdle and lower abdominal muscles  Glute strengthening exercises, focusing on proper firing of the glutes without engaging the low back muscles Proper stretching techniques for maximum relief for the hamstrings, hip flexors, low back and some rotation where tolerated   Proper technique shown and discussed handout in great detail with ATC.  All questions were discussed and answered.     Impression and Recommendations:     This case required medical decision making of moderate complexity.      Note: This dictation was prepared with Dragon dictation along with smaller phrase technology. Any transcriptional errors that result from this process are unintentional.

## 2016-12-09 ENCOUNTER — Encounter: Payer: Self-pay | Admitting: Family Medicine

## 2016-12-09 ENCOUNTER — Ambulatory Visit (INDEPENDENT_AMBULATORY_CARE_PROVIDER_SITE_OTHER)
Admission: RE | Admit: 2016-12-09 | Discharge: 2016-12-09 | Disposition: A | Discharge status: Home / Self Care | Payer: BLUE CROSS/BLUE SHIELD | Source: Ambulatory Visit | Attending: Family Medicine | Admitting: Family Medicine

## 2016-12-09 ENCOUNTER — Ambulatory Visit (INDEPENDENT_AMBULATORY_CARE_PROVIDER_SITE_OTHER): Payer: BLUE CROSS/BLUE SHIELD | Admitting: Family Medicine

## 2016-12-09 VITALS — BP 110/60 | HR 74 | Resp 16 | Ht 66.0 in | Wt 186.4 lb

## 2016-12-09 DIAGNOSIS — M48062 Spinal stenosis, lumbar region with neurogenic claudication: Secondary | ICD-10-CM | POA: Diagnosis not present

## 2016-12-09 DIAGNOSIS — M549 Dorsalgia, unspecified: Secondary | ICD-10-CM | POA: Diagnosis not present

## 2016-12-09 DIAGNOSIS — M545 Low back pain: Secondary | ICD-10-CM | POA: Diagnosis not present

## 2016-12-09 NOTE — Assessment & Plan Note (Signed)
Patient's symptoms seem to be more with a spinal stenosis. 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 I do get new x-rays and they are pending at this time. We discussed icing regimen and home exercises. We discussed which activities doing which ones to avoid. Encourage him to continue with the vitamin D. We discussed over-the-counter medications. Patient declined formal physical therapy, declined also gabapentin. Patient was given home exercises and work with Product/process development scientist. Patient come back in 4 weeks and see how patient response.

## 2016-12-09 NOTE — Patient Instructions (Signed)
Good to see you.  Ice 20 minutes 2 times daily. Usually after activity and before bed. Exercises 3 times a week.  pennsaid pinkie amount topically 2 times daily as needed.  Turmeric 500mg  2 times daily  Tart cherry extract any dose at night Think about the CT scan and write me when you decide See me again in 4ish weeks.

## 2016-12-30 ENCOUNTER — Other Ambulatory Visit: Payer: Self-pay

## 2016-12-30 ENCOUNTER — Encounter: Payer: Self-pay | Admitting: Family Medicine

## 2016-12-30 DIAGNOSIS — R918 Other nonspecific abnormal finding of lung field: Secondary | ICD-10-CM

## 2017-01-07 ENCOUNTER — Ambulatory Visit
Admission: RE | Admit: 2017-01-07 | Discharge: 2017-01-07 | Disposition: A | Discharge status: Home / Self Care | Payer: BLUE CROSS/BLUE SHIELD | Source: Ambulatory Visit | Attending: Family Medicine | Admitting: Family Medicine

## 2017-01-07 DIAGNOSIS — R918 Other nonspecific abnormal finding of lung field: Secondary | ICD-10-CM | POA: Diagnosis not present

## 2017-01-10 ENCOUNTER — Ambulatory Visit: Payer: BLUE CROSS/BLUE SHIELD | Admitting: Family Medicine

## 2017-02-03 ENCOUNTER — Ambulatory Visit (INDEPENDENT_AMBULATORY_CARE_PROVIDER_SITE_OTHER): Payer: BLUE CROSS/BLUE SHIELD | Admitting: Family Medicine

## 2017-02-03 ENCOUNTER — Encounter: Payer: Self-pay | Admitting: Family Medicine

## 2017-02-03 DIAGNOSIS — M48062 Spinal stenosis, lumbar region with neurogenic claudication: Secondary | ICD-10-CM

## 2017-02-03 MED ORDER — VITAMIN D (ERGOCALCIFEROL) 1.25 MG (50000 UNIT) PO CAPS: 50000.0000 [IU] | ORAL_CAPSULE | ORAL | 0 refills | Status: DC

## 2017-02-03 MED ORDER — GABAPENTIN 100 MG PO CAPS: 200.0000 mg | ORAL_CAPSULE | Freq: Every day | ORAL | 3 refills | Status: DC

## 2017-02-03 NOTE — Patient Instructions (Signed)
Good to see you  I am glad you are doing better Gabapentin 200mg  at night and can go from 1-3 pills a night if you need.  Keep up the exercises  Refilled the vitamin D for 3 months as well Have 500mg  of vitamin C with iron containing meals to help iron absorption  See me again when you return and safe travels

## 2017-02-03 NOTE — Assessment & Plan Note (Signed)
Improved significant knee at this time. Refill medications with [place] going [place] the [place]. We discussed icing regimen. Discussed continuing the other medications and added gabapentin for more nighttime relief. We discussed some of the muscle cramping could be secondary to iron deficiency. Return in 3 months

## 2017-02-03 NOTE — Progress Notes (Signed)
[facility] [address] [place], [place] [address] Phone: [phone] Subjective:    I'm seeing this patient by the request  of:  Binnie Rail, MD   CC: Back and leg pain f/u   [id number]  Frank Matthews is a 61 y.o. male coming in with complaint of back and leg pain. Past medical history significant for seizures. Patient states that he has had low back pain has been going on for multiple years if not decades. Patient was having signs and symptoms are more consistent with potentially lumbar radiculopathy or spinal stenosis.   patient did have x-rays of the lumbar spine showing very mild osteophytic changes at L5-S1. These were independently visualized by me.   Patient was to do conservative therapy and concluding over-the-counter medications as well as exercise stretching. Patient states He is doing much better. Feels like the vitamin D and the exercises have been significant only helpful. Was having some the pain is about 80% better. Still has some mild cramping from time to time but nothing severe. Patient still is having some of the cramping though and is hoping to this suggestions of the gabapentin we discussed previously.  Past Medical History:  Diagnosis Date  . H/O idiopathic seizure   . Mediterranean fever   . Seizures ([place]) 4-5 years   had one time seizure, none since then per pt.   Past Surgical History:  Procedure Laterality Date  . LEFT HEART CATHETERIZATION WITH CORONARY ANGIOGRAM N/A 07/23/2013   Procedure: LEFT HEART CATHETERIZATION WITH CORONARY ANGIOGRAM;  Surgeon: Blane Ohara, MD;  Location: [facility] CATH LAB;  Service: Cardiovascular;  Laterality: N/A;  . LUMBAR DISC SURGERY     L5-S1   Social History   Social History  . Marital status: Married    Spouse name: N/A  . Number of children: 1  . Years of education: N/A   Occupational History  . restauranteur    Social History Main Topics  . Smoking status: Never Smoker    . Smokeless tobacco: Never Used  . Alcohol use No  . Drug use: No  . Sexual activity: Not Asked   Other Topics Concern  . None   Social History Narrative   Sales promotion account executive   Married '91   1 daughter - '96   Work - Psychiatric nurse   No Known Allergies Family History  Problem Relation Age of Onset  . Other Father 103       deceased, accidental death  . Other Mother 93       natural causes  . Diabetes Brother   . Breast cancer Sister   . Colon cancer Neg Hx   . Prostate cancer Neg Hx   . Coronary artery disease Neg Hx   . Heart attack Neg Hx     Past medical history, social, surgical and family history all reviewed in electronic medical record.  No pertanent information unless stated regarding to the chief complaint.   Review of Systems:Review of systems updated and as accurate as of 02/03/17  No headache, visual changes, nausea, vomiting, diarrhea, constipation, dizziness, abdominal pain, skin rash, fevers, chills, night sweats, weight loss, swollen lymph nodes, body aches, joint swelling, muscle aches, chest pain, shortness of breath, mood changes.   Objective  Blood pressure 132/80, pulse 78, height 5\' 6"  (1.676 m), weight 185 lb (83.9 kg).   Systems examined below as of 02/03/17 General: NAD A&O x3 mood, affect normal  HEENT: Pupils equal,  extraocular movements intact no nystagmus Respiratory: not short of breath at rest or with speaking Cardiovascular: No lower extremity edema, non tender Skin: Warm dry intact with no signs of infection or rash on extremities or on axial skeleton. Abdomen: Soft nontender, no masses Neuro: Cranial nerves  intact, neurovascularly intact in all extremities with 2+ DTRs and 2+ pulses. Lymph: No lymphadenopathy appreciated today  Gait normal with good balance and coordination.  MSK: Non tender with full range of motion and good stability and symmetric strength and tone of shoulders, elbows, wrist,  knee hips and ankles  bilaterally.   Back Exam:  Inspection: Unremarkable  Motion: Flexion 45 deg, Extension 25 deg, Side Bending to 45 deg bilaterally,  Rotation to 45 deg bilaterally  SLR laying: Negative  XSLR laying: Negative  Palpable tenderness: Minimal discomfort today. Improved.Marland Kitchen FABER: negative. Sensory change: Gross sensation intact to all lumbar and sacral dermatomes.  Reflexes: 2+ at both patellar tendons, 2+ at achilles tendons, Babinski's downgoing.  Strength at foot  Plantar-flexion: 5/5 Dorsi-flexion: 5/5 Eversion: 5/5 Inversion: 5/5  Leg strength  Quad: 5/5 Hamstring: 5/5 Hip flexor: 5/5 Hip abductors: 5/5  Gait unremarkable.     Impression and Recommendations:     This case required medical decision making of moderate complexity.      Note: This dictation was prepared with Dragon dictation along with smaller phrase technology. Any transcriptional errors that result from this process are unintentional.

## 2017-04-30 ENCOUNTER — Other Ambulatory Visit: Payer: Self-pay | Admitting: Family Medicine

## 2017-07-12 ENCOUNTER — Telehealth: Payer: Self-pay | Admitting: Cardiovascular Disease

## 2017-07-12 DIAGNOSIS — E7800 Pure hypercholesterolemia, unspecified: Secondary | ICD-10-CM

## 2017-07-12 NOTE — Telephone Encounter (Signed)
Scheduled patient for yearly evaluation with Dr. Burt Knack on 11/19. He will come for fasting lab work 11/16. He was grateful for call and agrees with treatment plan.

## 2017-07-12 NOTE — Telephone Encounter (Signed)
New message  Pt call requesting to get an appt for November. Pt does not want to see a PA. Pt does not feel he should have to wait to be seen in January. Pt states he will be out of the country and only wants to be seen by Dr. Burt Knack when his recall is due. Please call back to discuss

## 2017-07-12 NOTE — Telephone Encounter (Signed)
Follow up    Patient request lab order. Wants order in place prior to appt with Vin Bhagat.

## 2017-07-29 ENCOUNTER — Other Ambulatory Visit: Payer: BLUE CROSS/BLUE SHIELD | Admitting: *Deleted

## 2017-07-29 DIAGNOSIS — E7800 Pure hypercholesterolemia, unspecified: Secondary | ICD-10-CM

## 2017-07-29 LAB — LIPID PANEL
CHOLESTEROL TOTAL: 131 mg/dL (ref 100–199)
Chol/HDL Ratio: 3.2 ratio (ref 0.0–5.0)
HDL: 41 mg/dL (ref >39)
LDL Calculated: 74 mg/dL (ref 0–99)
Triglycerides: 79 mg/dL (ref 0–149)
VLDL CHOLESTEROL CAL: 16 mg/dL (ref 5–40)

## 2017-07-29 LAB — COMPREHENSIVE METABOLIC PANEL
A/G RATIO: 1.9 (ref 1.2–2.2)
ALBUMIN: 4.7 g/dL (ref 3.5–5.5)
ALK PHOS: 60 IU/L (ref 39–117)
ALT: 22 IU/L (ref 0–44)
AST: 18 IU/L (ref 0–40)
BILIRUBIN TOTAL: 0.3 mg/dL (ref 0.0–1.2)
BUN / CREAT RATIO: 17 (ref 9–20)
BUN: 14 mg/dL (ref 6–24)
CHLORIDE: 101 mmol/L (ref 96–106)
CO2: 26 mmol/L (ref 20–29)
Calcium: 10 mg/dL (ref 8.7–10.2)
Creatinine, Ser: 0.84 mg/dL (ref 0.76–1.27)
GFR calc non Af Amer: 96 mL/min/{1.73_m2} (ref >59)
GFR, EST AFRICAN AMERICAN: 111 mL/min/{1.73_m2} (ref >59)
GLOBULIN, TOTAL: 2.5 g/dL (ref 1.5–4.5)
GLUCOSE: 102 mg/dL — AB (ref 65–99)
POTASSIUM: 4.5 mmol/L (ref 3.5–5.2)
SODIUM: 143 mmol/L (ref 134–144)
TOTAL PROTEIN: 7.2 g/dL (ref 6.0–8.5)

## 2017-08-01 ENCOUNTER — Encounter: Payer: Self-pay | Admitting: Cardiovascular Disease

## 2017-08-01 ENCOUNTER — Ambulatory Visit: Payer: BLUE CROSS/BLUE SHIELD | Admitting: Cardiovascular Disease

## 2017-08-01 VITALS — BP 140/60 | HR 74 | Ht 66.0 in | Wt 186.0 lb

## 2017-08-01 DIAGNOSIS — I2510 Atherosclerotic heart disease of native coronary artery without angina pectoris: Secondary | ICD-10-CM

## 2017-08-01 DIAGNOSIS — E7800 Pure hypercholesterolemia, unspecified: Secondary | ICD-10-CM

## 2017-08-01 NOTE — Patient Instructions (Signed)

## 2017-08-01 NOTE — Progress Notes (Signed)
Cardiology Office Note Date:  08/01/2017   ID:  Frank Matthews, DOB 05/24/1956, MRN [id number]  PCP:  Binnie Rail, MD  Cardiologist:  Janne Lab, MD    Chief Complaint  Patient presents with  . vertebral artery stenosis    follow up     History of Present Illness: Frank Matthews is a 61 y.o. male who presents for follow-up of coronary artery disease. He initially presented in 2014 with typical symptoms of exertional angina. He underwent cardiac catheterization demonstrating severe stenosis of a diagonal branch and total occlusion of a small diagonal subbranch. He had no coronary obstruction in his major coronary branches. Medical therapy was recommended. He was started on a beta blocker at that time.  The patient is here alone today. He feels well. Walking 5 miles at a time without exertional symptoms. Today, he denies symptoms of palpitations, chest pain, shortness of breath, orthopnea, PND, lower extremity edema, dizziness, or syncope.    Past Medical History:  Diagnosis Date  . H/O idiopathic seizure   . Mediterranean fever   . Seizures ([place]) 4-5 years   had one time seizure, none since then per pt.    Past Surgical History:  Procedure Laterality Date  . LEFT HEART CATHETERIZATION WITH CORONARY ANGIOGRAM N/A 07/23/2013   Performed by Sherren Mocha, MD at [facility] CATH LAB  . LUMBAR DISC SURGERY     L5-S1    Current Outpatient Medications  Medication Sig Dispense Refill  . aspirin 81 MG tablet Take 1 tablet (81 mg total) by mouth daily.    Marland Kitchen atorvastatin (LIPITOR) 10 MG tablet Take 1 tablet (10 mg total) by mouth daily at 6 PM. 90 tablet 3  . gabapentin (NEURONTIN) 100 MG capsule Take 2 capsules (200 mg total) by mouth at bedtime. 180 capsule 3  . metoprolol succinate (TOPROL XL) 25 MG 24 hr tablet Take 0.5 tablets (12.5 mg total) by mouth at bedtime. 45 tablet 3  . Vitamin D, Ergocalciferol, (DRISDOL) 50000 units CAPS capsule TAKE 1 CAPSULE (50,000 UNITS TOTAL)  BY MOUTH EVERY 7 (SEVEN) DAYS. 12 capsule 0   No current facility-administered medications for this visit.     Allergies:   Patient has no known allergies.   Social History:  The patient  reports that  has never smoked. he has never used smokeless tobacco. He reports that he does not drink alcohol or use drugs.   Family History:  The patient's family history includes Breast cancer in his sister; Diabetes in his brother; Other (age of onset: 100) in his father; Other (age of onset: 107) in his mother.    ROS:  Please see the history of present illness.  All other systems are reviewed and negative.    PHYSICAL EXAM: VS:  BP 140/60   Pulse 74   Ht 5\' 6"  (1.676 m)   Wt 186 lb (84.4 kg)   BMI 30.02 kg/m  , BMI Body mass index is 30.02 kg/m. GEN: Well nourished, well developed, in no acute distress  HEENT: normal  Neck: no JVD, no masses. No carotid bruits Cardiac: RRR without murmur or gallop                Respiratory:  clear to auscultation bilaterally, normal work of breathing GI: soft, nontender, nondistended, + BS MS: no deformity or atrophy  Ext: no pretibial edema, pedal pulses 2+= bilaterally Skin: warm and dry, no rash Neuro:  Strength and sensation are intact Psych: euthymic mood,  full affect  EKG:  EKG is ordered today. The ekg ordered today shows NSR 74 bpm, within normal limits  Recent Labs: 11/25/2016: Hemoglobin 14.2; Magnesium 2.2; Platelets 211.0; TSH 3.69 07/29/2017: ALT 22; BUN 14; Creatinine, Ser 0.84; Potassium 4.5; Sodium 143   Lipid Panel     Component Value Date/Time   CHOL 131 07/29/2017 1014   TRIG 79 07/29/2017 1014   HDL 41 07/29/2017 1014   CHOLHDL 3.2 07/29/2017 1014   CHOLHDL 3 11/25/2016 0825   VLDL 33.2 11/25/2016 0825   LDLCALC 74 07/29/2017 1014   LDLDIRECT 139.2 07/12/2013 1639      Wt Readings from Last 3 Encounters:  08/01/17 186 lb (84.4 kg)  02/03/17 185 lb (83.9 kg)  12/09/16 186 lb 6 oz (84.5 kg)     ASSESSMENT AND  PLAN: 1.  CAD, native vessel, with angina: remains asymptomatic on medical Rx with ASA, a statin drug, and a beta-blocker. FU one year.   2. Hyperlipidemia: last lipids reviewed and LDL at goal. Continue atorvastatin 10 mg.   Current medicines are reviewed with the patient today.  The patient does not have concerns regarding medicines.  Labs/ tests ordered today include:   Orders Placed This Encounter  Procedures  . EKG 12-Lead    Disposition:   FU one year  Signed, Janne Lab, MD  08/01/2017 1:24 PM    [facility] [address], [place], [place]  [address] Phone: [phone]; Fax: [phone]

## 2017-08-11 ENCOUNTER — Ambulatory Visit: Payer: BLUE CROSS/BLUE SHIELD | Admitting: Physician Assistant

## 2017-08-20 ENCOUNTER — Other Ambulatory Visit: Payer: Self-pay | Admitting: Family Medicine

## 2017-08-23 NOTE — Telephone Encounter (Signed)
Refill denied. Pt has completed course of treatment.  

## 2017-11-14 IMAGING — CT CT CHEST W/O CM
3 of 4 series · 16 of 30 positions shown, 18 images · non-contrast
Comparison: July 16, 2013

CLINICAL DATA: Pulmonary nodules

EXAM:
CT CHEST WITHOUT CONTRAST
TECHNIQUE: Multidetector CT imaging of the chest was performed following the
standard protocol without IV contrast.

[Series 3: chest w/o · axial · non-contrast · 0.73mm/px · z∈[-265,-10]mm · 7 of 138 slices shown, 9 images]
[im 18/138  mediastinal]
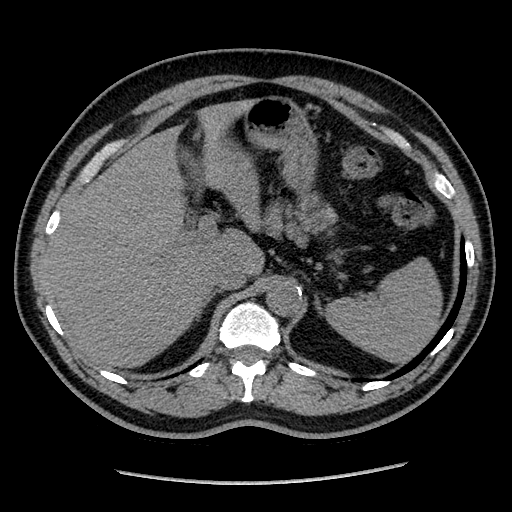
[im 18/138  lung]
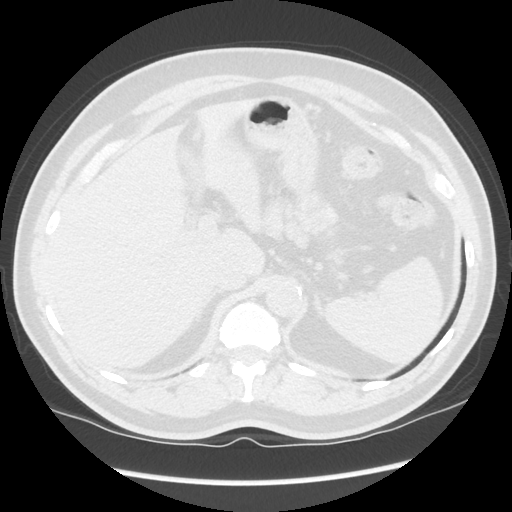
[im 35/138  lung]
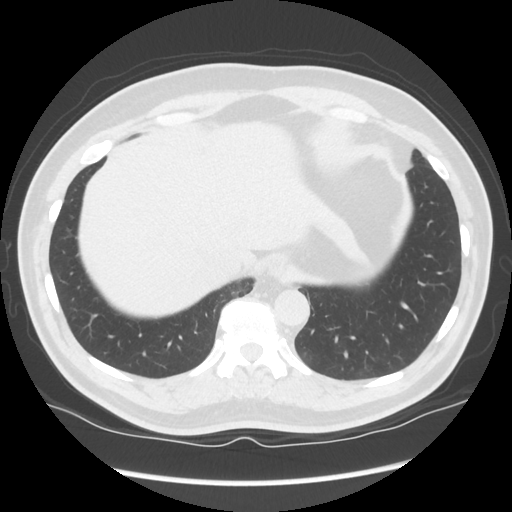
[im 52/138  lung]
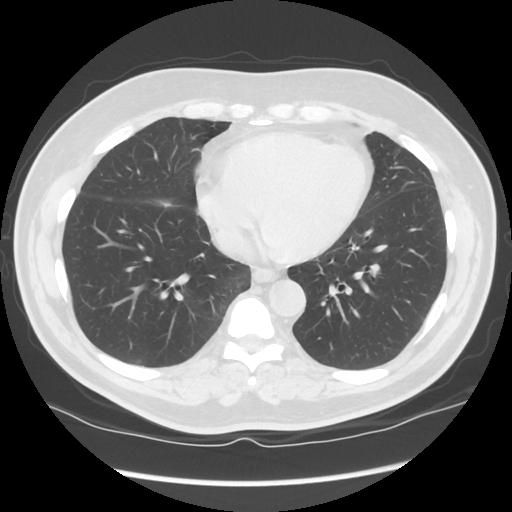
[im 69/138  lung]
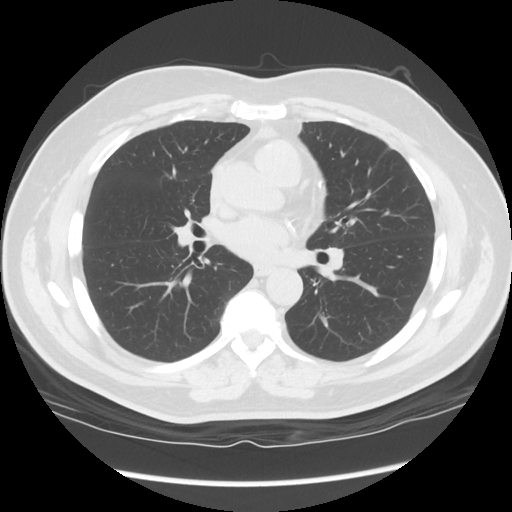
[im 86/138  mediastinal]
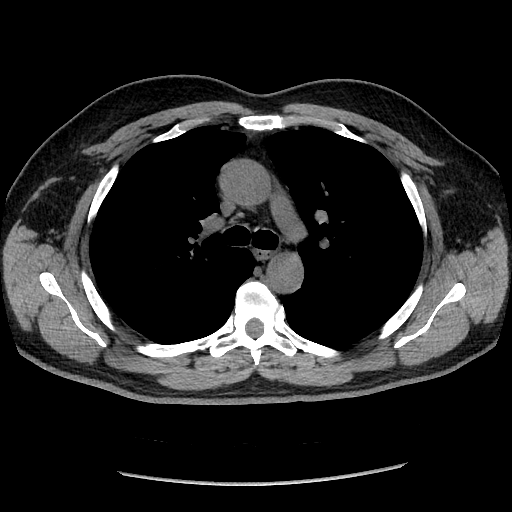
[im 86/138  lung]
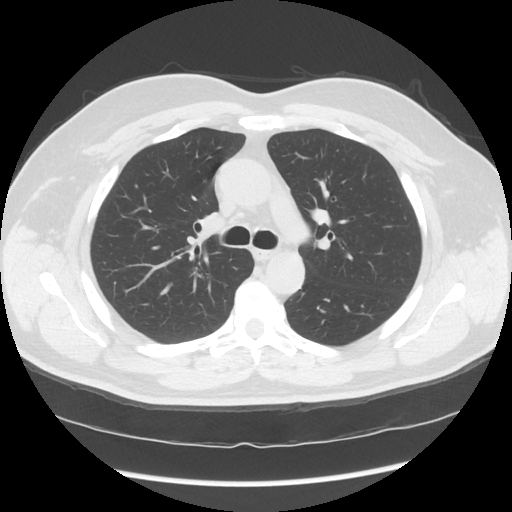
[im 103/138  lung]
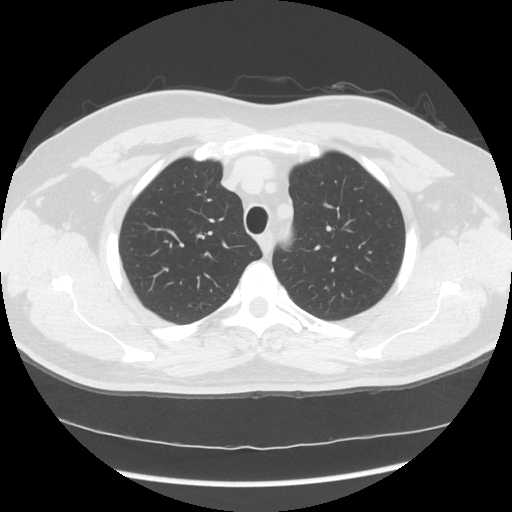
[im 120/138  lung]
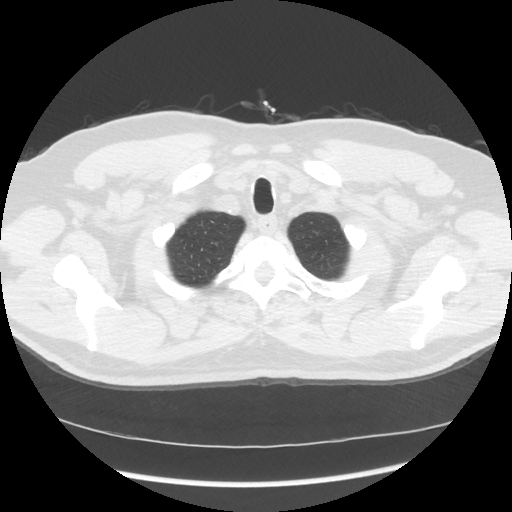

[Series 4: lung windows · axial · 0.73mm/px · z∈[-265,-10]mm · 7 of 138 slices shown]
[im 18/138  lung]
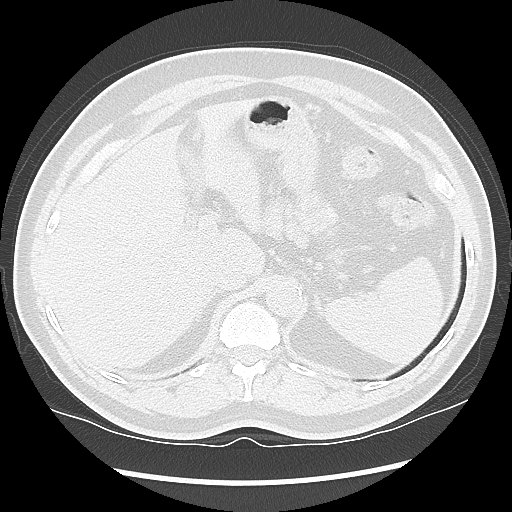
[im 35/138  lung]
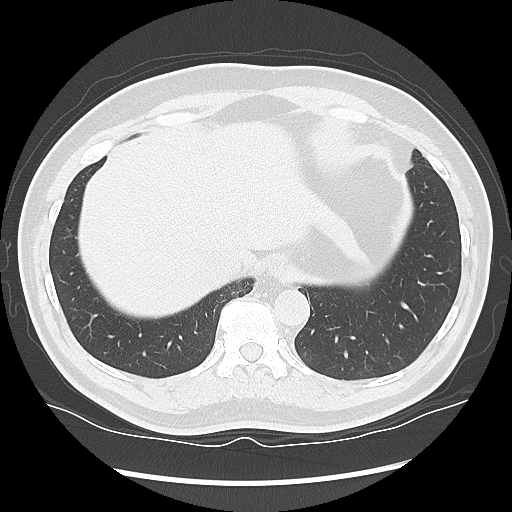
[im 52/138  lung]
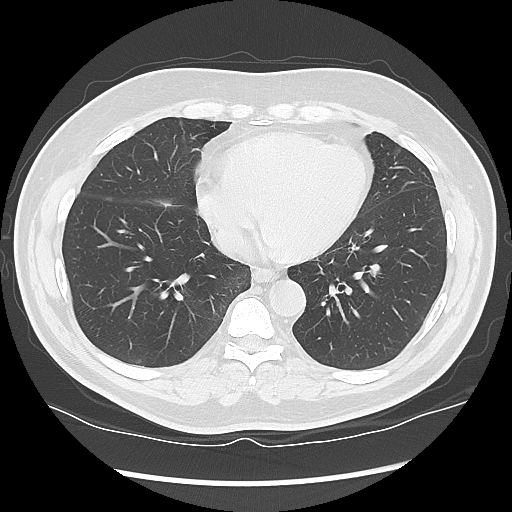
[im 69/138  lung]
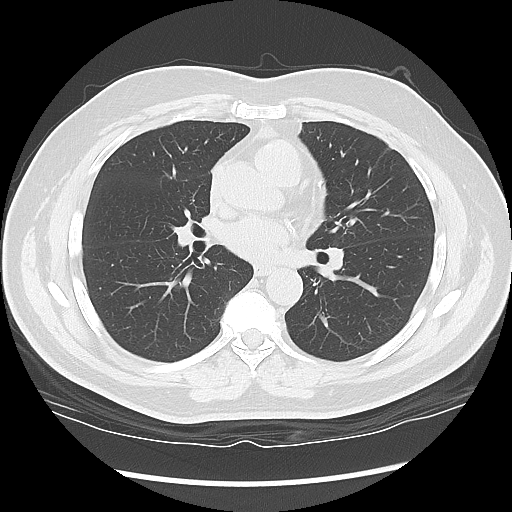
[im 86/138  lung]
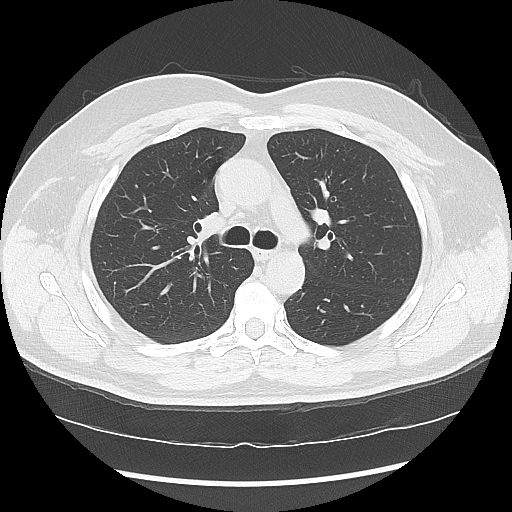
[im 103/138  lung]
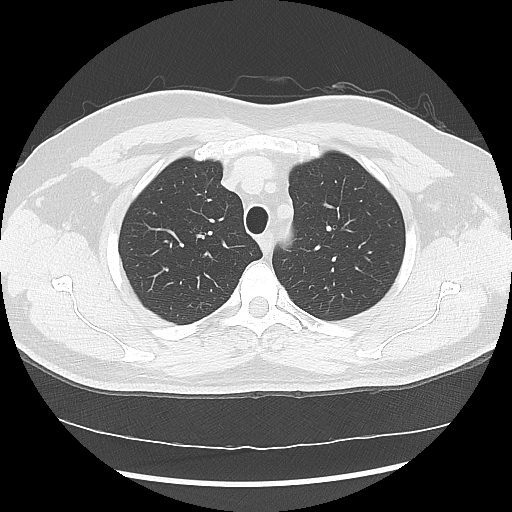
[im 120/138  lung]
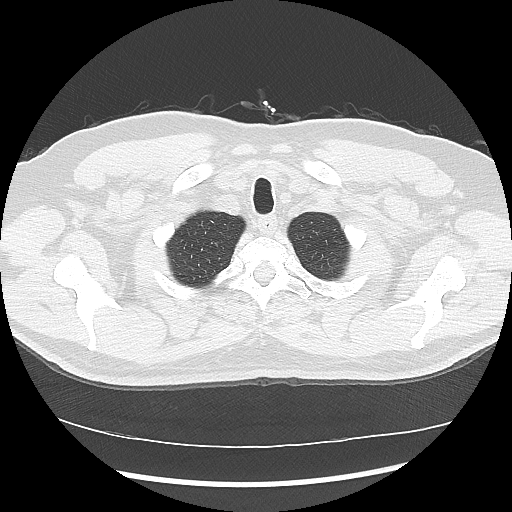

[Series 602: sagittal body · sagittal · 0.73mm/px · 2 of 151 slices shown]
[im 17/151  mediastinal]
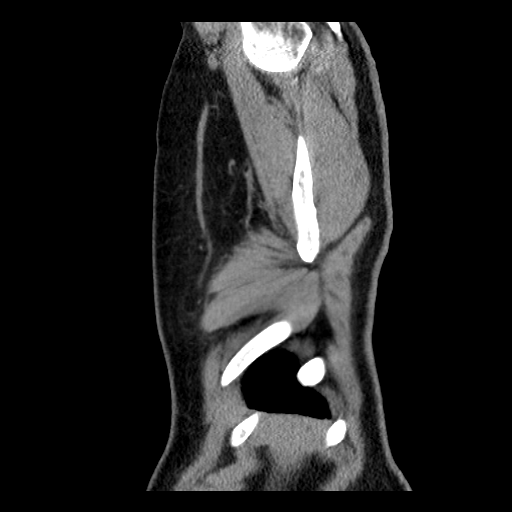
[im 34/151  mediastinal]
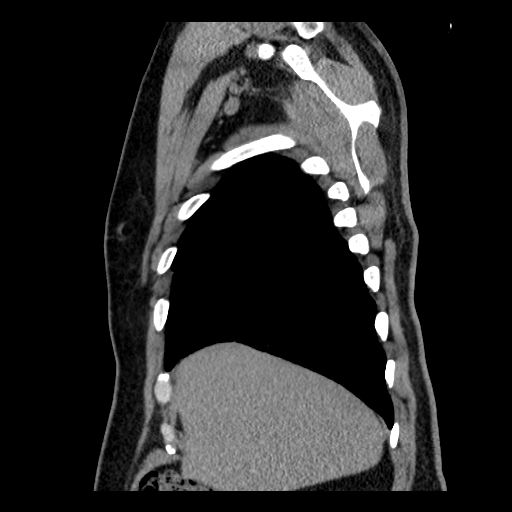

[16 of 30 positions shown; findings below may reference images not displayed]

FINDINGS: Cardiovascular: There is no appreciable thoracic aortic aneurysm.
Visualized great vessels show minimal atherosclerotic calcification.
There are scattered foci of atherosclerotic calcification in the
aorta. There are multiple foci of coronary artery calcification.
Pericardium is not appreciably thickened.

Mediastinum/Nodes: Thyroid appears normal. There is no appreciable
thoracic adenopathy. There are a few subcentimeter mediastinal lymph
nodes which do not meet size criteria for pathologic significance.

Lungs/Pleura: On axial slice twenty-three series 4, there is a
stable 3 mm nodular opacity in the posterior right apex. On axial
slice 38 series 4, there is a stable 2 mm nodular opacity in the
posterior segment of the right upper lobe. On axial slice 38 series
4, there is a stable 2 mm nodular opacity in the posterior segment
of the left upper lobe. On axial slice 90 series 4, there is a 4 mm
nodular opacity in the medial segment right middle lobe, not
appreciable on prior study. There is nearby scarring in this area.
On axial slice 79 series 4, there is a 4 mm nodular opacity in the
superior segment of the right lower lobe, stable. On axial slice 100
as series 4, there is a stable 5 mm nodular opacity in the anterior
segment of the left lower lobe, perhaps minimally smaller than on
the previous study. No larger pulmonary nodular lesions are evident.
Scarring in the anterior lung bases is present. There is no edema or
consolidation. No pleural effusion or pleural thickening evident.

Upper Abdomen: In the visualized upper abdomen, there is a calcified
granuloma in the liver. There is atherosclerotic calcification in
the aorta.

Musculoskeletal: There are no blastic or lytic bone lesions.
IMPRESSION: Scattered small nodular opacities, not changed. There is a single
apparent new nodular opacity in the right middle lobe seen on axial
slice 90 series 4. No follow-up needed if patient is low-risk (and
has no known or suspected primary neoplasm). Non-contrast chest CT
can be considered in 12 months if patient is high-risk. This
recommendation follows the consensus statement: Guidelines for
Management of Incidental Pulmonary Nodules Detected on CT Images:
edema or consolidation. No evident adenopathy. There are foci of
atherosclerotic calcification as well as foci of coronary artery
calcification evident. Small granuloma in the liver.

## 2017-11-27 NOTE — Progress Notes (Signed)
Subjective:    Patient ID: Frank Matthews, male    DOB: 1955-10-02, 62 y.o.   MRN: [id number]  HPI He is here for a physical exam.   He just returned from [place] about 2 weeks ago.  He started to feel sick and better and thought he had the flu.  He has been experiencing body aches, nasal congestion, hoarseness, fever.  Fever has gotten better and overall he is feeling better.  He thinks his symptoms are improving with home remedies.  Over the past several month he has had more fatigue.  He is sleeping well.  He denies any changes in medication or lifestyle changes.  He is not been exercising as much because of the weather.  Medications and allergies reviewed with patient and updated if appropriate.  Patient Active Problem List   Diagnosis Date Noted  . Spinal stenosis, lumbar region with neurogenic claudication 12/09/2016  . Chronic lower back pain 07/06/2016  . Muscle cramping 07/06/2016  . Prediabetes 11/30/2015  . Abdominal bloating 11/28/2015  . Acute right flank pain 07/15/2013  . CAD (coronary artery disease) 07/15/2013  . Other malaise and fatigue 12/04/2011  . Regurgitation 12/02/2011  . Elevated cholesterol 12/02/2011  . MICROSCOPIC HEMATURIA 11/09/2008    Current Outpatient Medications on File Prior to Visit  Medication Sig Dispense Refill  . aspirin 81 MG tablet Take 1 tablet (81 mg total) by mouth daily.    Marland Kitchen atorvastatin (LIPITOR) 10 MG tablet Take 1 tablet (10 mg total) by mouth daily at 6 PM. 90 tablet 3  . gabapentin (NEURONTIN) 100 MG capsule Take 2 capsules (200 mg total) by mouth at bedtime. 180 capsule 3  . metoprolol succinate (TOPROL XL) 25 MG 24 hr tablet Take 0.5 tablets (12.5 mg total) by mouth at bedtime. 45 tablet 3  . Vitamin D, Ergocalciferol, (DRISDOL) 50000 units CAPS capsule TAKE 1 CAPSULE (50,000 UNITS TOTAL) BY MOUTH EVERY 7 (SEVEN) DAYS. 12 capsule 0   No current facility-administered medications on file prior to visit.     Past  Medical History:  Diagnosis Date  . H/O idiopathic seizure   . Mediterranean fever   . Seizures ([place]) 4-5 years   had one time seizure, none since then per pt.    Past Surgical History:  Procedure Laterality Date  . LEFT HEART CATHETERIZATION WITH CORONARY ANGIOGRAM N/A 07/23/2013   Procedure: LEFT HEART CATHETERIZATION WITH CORONARY ANGIOGRAM;  Surgeon: Blane Ohara, MD;  Location: [facility] CATH LAB;  Service: Cardiovascular;  Laterality: N/A;  . LUMBAR DISC SURGERY     L5-S1    Social History   Socioeconomic History  . Marital status: Married    Spouse name: None  . Number of children: 1  . Years of education: None  . Highest education level: None  Social Needs  . Financial resource strain: None  . Food insecurity - worry: None  . Food insecurity - inability: None  . Transportation needs - medical: None  . Transportation needs - non-medical: None  Occupational History  . Occupation: restauranteur  Tobacco Use  . Smoking status: Never Smoker  . Smokeless tobacco: Never Used  Substance and Sexual Activity  . Alcohol use: No  . Drug use: No  . Sexual activity: None  Other Topics Concern  . None  Social History Narrative   Sales promotion account executive   Married '91   1 daughter - '96   Work - Psychiatric nurse    Family History  Problem Relation  Age of Onset  . Other Father 40       deceased, accidental death  . Other Mother 6       natural causes  . Diabetes Brother   . Breast cancer Sister   . Colon cancer Neg Hx   . Prostate cancer Neg Hx   . Coronary artery disease Neg Hx   . Heart attack Neg Hx     Review of Systems  Constitutional: Positive for fatigue. Negative for chills and fever (resolved).  HENT: Positive for congestion and postnasal drip. Negative for ear pain and sore throat.   Eyes: Negative for visual disturbance.  Respiratory: Positive for cough (dry with deep breaths) and wheezing. Negative for shortness of breath.   Cardiovascular:  Negative for chest pain, palpitations and leg swelling.  Gastrointestinal: Negative for abdominal pain, blood in stool, constipation, diarrhea and nausea.       No gerd  Genitourinary: Negative for difficulty urinating, dysuria and hematuria.  Musculoskeletal: Positive for back pain.  Skin: Negative for color change and rash.  Neurological: Negative for dizziness, light-headedness, numbness and headaches.  Psychiatric/Behavioral: Negative for dysphoric mood and sleep disturbance. The patient is not nervous/anxious.        Objective:   Vitals:   11/28/17 1414  BP: 122/68  Pulse: 84  Resp: 16  Temp: 98.3 F (36.8 C)  SpO2: 95%   Filed Weights   11/28/17 1414  Weight: 186 lb (84.4 kg)   Body mass index is 30.02 kg/m.  Wt Readings from Last 3 Encounters:  11/28/17 186 lb (84.4 kg)  08/01/17 186 lb (84.4 kg)  02/03/17 185 lb (83.9 kg)     Physical Exam Constitutional: He appears well-developed and well-nourished. No distress.  HENT:  Head: Normocephalic and atraumatic.  Right Ear: External ear normal.  Left Ear: External ear normal.  Mouth/Throat: Oropharynx is clear and moist.  Normal ear canals and TM b/l  Eyes: Conjunctivae and EOM are normal.  Neck: Neck supple. No tracheal deviation present. No thyromegaly present.  No carotid bruit  Cardiovascular: Normal rate, regular rhythm, normal heart sounds and intact distal pulses.   No murmur heard. Pulmonary/Chest: Effort normal and breath sounds normal. No respiratory distress. He has no wheezes. He has no rales.  Abdominal: Soft. He exhibits no distension. There is no tenderness.  Genitourinary: deferred  Musculoskeletal: He exhibits no edema.  Lymphadenopathy:   He has no cervical adenopathy.  Skin: Skin is warm and dry. He is not diaphoretic.  Psychiatric: He has a normal mood and affect. His behavior is normal.         Assessment & Plan:   Physical exam: Screening blood work    ordered Immunizations   Flu  vaccine deferred , discussed shingles, td up to date Colonoscopy  Up to date  Eye exams   Up to date  EKG  Done 07/2017 Exercise  walking Weight  Obese - advised weight loss Skin    No concerns Substance abuse   none  See Problem List for Assessment and Plan of chronic medical problems.   FU in one year

## 2017-11-27 NOTE — Patient Instructions (Addendum)
Test(s) ordered today. Your results will be released to MyChart (or called to you) after review, usually within 72hours after test completion. If any changes need to be made, you will be notified at that same time.  All other Health Maintenance issues reviewed.   All recommended immunizations and age-appropriate screenings are up-to-date or discussed.  No immunizations administered today.   Medications reviewed and updated.  No changes recommended at this time.   Please followup in one year    Health Maintenance, Male A healthy lifestyle and preventive care is important for your health and wellness. Ask your health care provider about what schedule of regular examinations is right for you. What should I know about weight and diet? Eat a Healthy Diet  Eat plenty of vegetables, fruits, whole grains, low-fat dairy products, and lean protein.  Do not eat a lot of foods high in solid fats, added sugars, or salt.  Maintain a Healthy Weight Regular exercise can help you achieve or maintain a healthy weight. You should:  Do at least 150 minutes of exercise each week. The exercise should increase your heart rate and make you sweat (moderate-intensity exercise).  Do strength-training exercises at least twice a week.  Watch Your Levels of Cholesterol and Blood Lipids  Have your blood tested for lipids and cholesterol every 5 years starting at 60 years of age. If you are at high risk for heart disease, you should start having your blood tested when you are 60 years old. You may need to have your cholesterol levels checked more often if: ? Your lipid or cholesterol levels are high. ? You are older than 60 years of age. ? You are at high risk for heart disease.  What should I know about cancer screening? Many types of cancers can be detected early and may often be prevented. Lung Cancer  You should be screened every year for lung cancer if: ? You are a current smoker who has smoked for  at least 30 years. ? You are a former smoker who has quit within the past 15 years.  Talk to your health care provider about your screening options, when you should start screening, and how often you should be screened.  Colorectal Cancer  Routine colorectal cancer screening usually begins at 60 years of age and should be repeated every 5-10 years until you are 60 years old. You may need to be screened more often if early forms of precancerous polyps or small growths are found. Your health care provider may recommend screening at an earlier age if you have risk factors for colon cancer.  Your health care provider may recommend using home test kits to check for hidden blood in the stool.  A small camera at the end of a tube can be used to examine your colon (sigmoidoscopy or colonoscopy). This checks for the earliest forms of colorectal cancer.  Prostate and Testicular Cancer  Depending on your age and overall health, your health care provider may do certain tests to screen for prostate and testicular cancer.  Talk to your health care provider about any symptoms or concerns you have about testicular or prostate cancer.  Skin Cancer  Check your skin from head to toe regularly.  Tell your health care provider about any new moles or changes in moles, especially if: ? There is a change in a mole's size, shape, or color. ? You have a mole that is larger than a pencil eraser.  Always use sunscreen. Apply sunscreen liberally   and repeat throughout the day.  Protect yourself by wearing long sleeves, pants, a wide-brimmed hat, and sunglasses when outside.  What should I know about heart disease, diabetes, and high blood pressure?  If you are 18-39 years of age, have your blood pressure checked every 3-5 years. If you are 40 years of age or older, have your blood pressure checked every year. You should have your blood pressure measured twice-once when you are at a hospital or clinic, and once  when you are not at a hospital or clinic. Record the average of the two measurements. To check your blood pressure when you are not at a hospital or clinic, you can use: ? An automated blood pressure machine at a pharmacy. ? A home blood pressure monitor.  Talk to your health care provider about your target blood pressure.  If you are between 45-79 years old, ask your health care provider if you should take aspirin to prevent heart disease.  Have regular diabetes screenings by checking your fasting blood sugar level. ? If you are at a normal weight and have a low risk for diabetes, have this test once every three years after the age of 45. ? If you are overweight and have a high risk for diabetes, consider being tested at a younger age or more often.  A one-time screening for abdominal aortic aneurysm (AAA) by ultrasound is recommended for men aged 65-75 years who are current or former smokers. What should I know about preventing infection? Hepatitis B If you have a higher risk for hepatitis B, you should be screened for this virus. Talk with your health care provider to find out if you are at risk for hepatitis B infection. Hepatitis C Blood testing is recommended for:  Everyone born from 1945 through 1965.  Anyone with known risk factors for hepatitis C.  Sexually Transmitted Diseases (STDs)  You should be screened each year for STDs including gonorrhea and chlamydia if: ? You are sexually active and are younger than 60 years of age. ? You are older than 60 years of age and your health care provider tells you that you are at risk for this type of infection. ? Your sexual activity has changed since you were last screened and you are at an increased risk for chlamydia or gonorrhea. Ask your health care provider if you are at risk.  Talk with your health care provider about whether you are at high risk of being infected with HIV. Your health care provider may recommend a prescription  medicine to help prevent HIV infection.  What else can I do?  Schedule regular health, dental, and eye exams.  Stay current with your vaccines (immunizations).  Do not use any tobacco products, such as cigarettes, chewing tobacco, and e-cigarettes. If you need help quitting, ask your health care provider.  Limit alcohol intake to no more than 2 drinks per day. One drink equals 12 ounces of beer, 5 ounces of wine, or 1 ounces of hard liquor.  Do not use street drugs.  Do not share needles.  Ask your health care provider for help if you need support or information about quitting drugs.  Tell your health care provider if you often feel depressed.  Tell your health care provider if you have ever been abused or do not feel safe at home. This information is not intended to replace advice given to you by your health care provider. Make sure you discuss any questions you have with your health   care provider. Document Released: 02/26/2008 Document Revised: 04/28/2016 Document Reviewed: 06/03/2015 Elsevier Interactive Patient Education  2018 Elsevier Inc.  

## 2017-11-28 ENCOUNTER — Ambulatory Visit: Payer: BLUE CROSS/BLUE SHIELD | Admitting: Internal Medicine

## 2017-11-28 ENCOUNTER — Encounter: Payer: Self-pay | Admitting: Internal Medicine

## 2017-11-28 VITALS — BP 122/68 | HR 84 | Temp 98.3°F | Resp 16 | Wt 186.0 lb

## 2017-11-28 DIAGNOSIS — I2510 Atherosclerotic heart disease of native coronary artery without angina pectoris: Secondary | ICD-10-CM

## 2017-11-28 DIAGNOSIS — Z0000 Encounter for general adult medical examination without abnormal findings: Secondary | ICD-10-CM

## 2017-11-28 DIAGNOSIS — R7303 Prediabetes: Secondary | ICD-10-CM

## 2017-11-28 DIAGNOSIS — E7800 Pure hypercholesterolemia, unspecified: Secondary | ICD-10-CM

## 2017-11-28 NOTE — Assessment & Plan Note (Signed)
No chest pain, palpitations or shortness of breath Following with cardiology Continue baby aspirin, statin and metoprolol Increase exercise Advised weight loss

## 2017-11-28 NOTE — Assessment & Plan Note (Signed)
Check a1c Low sugar / carb diet Stressed regular exercise, weight loss  

## 2017-11-28 NOTE — Assessment & Plan Note (Signed)
Check lipid panel  Continue daily statin Regular exercise and healthy diet encouraged  

## 2017-11-29 ENCOUNTER — Encounter: Payer: Self-pay | Admitting: Internal Medicine

## 2017-11-30 ENCOUNTER — Other Ambulatory Visit (INDEPENDENT_AMBULATORY_CARE_PROVIDER_SITE_OTHER): Payer: BLUE CROSS/BLUE SHIELD

## 2017-11-30 DIAGNOSIS — E7800 Pure hypercholesterolemia, unspecified: Secondary | ICD-10-CM

## 2017-11-30 DIAGNOSIS — R7303 Prediabetes: Secondary | ICD-10-CM | POA: Diagnosis not present

## 2017-11-30 DIAGNOSIS — Z0000 Encounter for general adult medical examination without abnormal findings: Secondary | ICD-10-CM | POA: Diagnosis not present

## 2017-11-30 LAB — COMPREHENSIVE METABOLIC PANEL
ALBUMIN: 4.7 g/dL (ref 3.5–5.2)
ALK PHOS: 57 U/L (ref 39–117)
ALT: 18 U/L (ref 0–53)
AST: 16 U/L (ref 0–37)
BILIRUBIN TOTAL: 0.2 mg/dL (ref 0.2–1.2)
BUN: 17 mg/dL (ref 6–23)
CO2: 28 mEq/L (ref 19–32)
CREATININE: 0.85 mg/dL (ref 0.40–1.50)
Calcium: 10.5 mg/dL (ref 8.4–10.5)
Chloride: 103 mEq/L (ref 96–112)
GFR: 97.86 mL/min (ref >60.00)
GLUCOSE: 113 mg/dL — AB (ref 70–99)
POTASSIUM: 4.7 meq/L (ref 3.5–5.1)
SODIUM: 144 meq/L (ref 135–145)
TOTAL PROTEIN: 8.1 g/dL (ref 6.0–8.3)

## 2017-11-30 LAB — LIPID PANEL
CHOL/HDL RATIO: 4
Cholesterol: 141 mg/dL (ref 0–200)
HDL: 38 mg/dL — AB (ref >39.00)
LDL Cholesterol: 82 mg/dL (ref 0–99)
NonHDL: 103.47
Triglycerides: 106 mg/dL (ref 0.0–149.0)
VLDL: 21.2 mg/dL (ref 0.0–40.0)

## 2017-11-30 LAB — CBC WITH DIFFERENTIAL/PLATELET
BASOS ABS: 0.1 10*3/uL (ref 0.0–0.1)
Basophils Relative: 0.7 % (ref 0.0–3.0)
EOS PCT: 3.6 % (ref 0.0–5.0)
Eosinophils Absolute: 0.3 10*3/uL (ref 0.0–0.7)
HCT: 42.1 % (ref 39.0–52.0)
HEMOGLOBIN: 14.1 g/dL (ref 13.0–17.0)
LYMPHS ABS: 1.9 10*3/uL (ref 0.7–4.0)
Lymphocytes Relative: 21.7 % (ref 12.0–46.0)
MCHC: 33.5 g/dL (ref 30.0–36.0)
MCV: 85.8 fl (ref 78.0–100.0)
MONO ABS: 0.6 10*3/uL (ref 0.1–1.0)
Monocytes Relative: 7 % (ref 3.0–12.0)
NEUTROS PCT: 67 % (ref 43.0–77.0)
Neutro Abs: 5.8 10*3/uL (ref 1.4–7.7)
Platelets: 257 10*3/uL (ref 150.0–400.0)
RBC: 4.9 Mil/uL (ref 4.22–5.81)
RDW: 13.3 % (ref 11.5–15.5)
WBC: 8.6 10*3/uL (ref 4.0–10.5)

## 2017-11-30 LAB — TSH: TSH: 1.5 u[IU]/mL (ref 0.35–4.50)

## 2017-11-30 LAB — HEMOGLOBIN A1C: HEMOGLOBIN A1C: 6.4 % (ref 4.6–6.5)

## 2017-12-01 ENCOUNTER — Encounter: Payer: Self-pay | Admitting: Internal Medicine

## 2017-12-01 LAB — PSA, TOTAL AND FREE
PSA, % FREE: 50 % (ref >25)
PSA, FREE: 0.1 ng/mL
PSA, TOTAL: 0.2 ng/mL (ref <=4.0)

## 2017-12-01 MED ORDER — AMOXICILLIN-POT CLAVULANATE 875-125 MG PO TABS: 1.0000 | ORAL_TABLET | Freq: Two times a day (BID) | ORAL | 0 refills | Status: DC

## 2018-02-08 ENCOUNTER — Other Ambulatory Visit: Payer: Self-pay | Admitting: Internal Medicine

## 2018-02-08 DIAGNOSIS — E785 Hyperlipidemia, unspecified: Secondary | ICD-10-CM

## 2018-03-24 ENCOUNTER — Other Ambulatory Visit: Payer: Self-pay | Admitting: Family Medicine

## 2018-05-25 NOTE — Progress Notes (Signed)
Subjective:    Patient ID: Frank Matthews, male    DOB: Apr 02, 1956, 62 y.o.   MRN: [id number]  HPI The patient is here for an acute visit for foot issues, nails turning black..   Bilateral second toe nails have turned black.  No other toe is affected.  3-4 weeks ago he started walking a little more and he is noticed the discoloration since then.  He typically walks 5-6 miles a day.  He wears the same sneakers and has not changed them, but he does change the inserts.  He does get some bilateral second toe discomfort at the end of his walk.  He wears a size 8.5 shoes and sneakers.  He denies any numbness or tingling.   Vitamin D deficiency: He had blood work done when he was in [place] this summer.  He was found to have a very low vitamin D level at 8.  He was prescribed to 50,000 unit dose he took that for 12 doses.  He has not yet started a daily vitamin D.  He wondered about checking his vitamin D level.  Prediabetes:  He is compliant with a low sugar/carbohydrate diet.  He is exercising regularly.  Coronary artery disease, hyperlipidemia: He is taking his medication daily. He is compliant with a low fat/cholesterol diet. He is exercising regularly. He denies myalgias.      Medications and allergies reviewed with patient and updated if appropriate.  Patient Active Problem List   Diagnosis Date Noted  . Vitamin D deficiency 05/26/2018  . Contusion of toenail 05/26/2018  . Spinal stenosis, lumbar region with neurogenic claudication 12/09/2016  . Chronic lower back pain 07/06/2016  . Muscle cramping 07/06/2016  . Prediabetes 11/30/2015  . CAD (coronary artery disease) 07/15/2013  . Other malaise and fatigue 12/04/2011  . Elevated cholesterol 12/02/2011  . MICROSCOPIC HEMATURIA 11/09/2008    Current Outpatient Medications on File Prior to Visit  Medication Sig Dispense Refill  . aspirin 81 MG tablet Take 1 tablet (81 mg total) by mouth daily.    Marland Kitchen atorvastatin (LIPITOR) 10 MG  tablet TAKE 1 TABLET (10 MG TOTAL) BY MOUTH DAILY AT 6 PM. 90 tablet 2  . gabapentin (NEURONTIN) 100 MG capsule TAKE 2 CAPSULES (200 MG TOTAL) BY MOUTH AT BEDTIME. 180 capsule 3  . metoprolol succinate (TOPROL-XL) 25 MG 24 hr tablet TAKE 0.5 TABLETS (12.5 MG TOTAL) BY MOUTH AT BEDTIME. 45 tablet 2   No current facility-administered medications on file prior to visit.     Past Medical History:  Diagnosis Date  . H/O idiopathic seizure   . Mediterranean fever   . Seizures ([place]) 4-5 years   had one time seizure, none since then per pt.    Past Surgical History:  Procedure Laterality Date  . LEFT HEART CATHETERIZATION WITH CORONARY ANGIOGRAM N/A 07/23/2013   Procedure: LEFT HEART CATHETERIZATION WITH CORONARY ANGIOGRAM;  Surgeon: Blane Ohara, MD;  Location: [facility] CATH LAB;  Service: Cardiovascular;  Laterality: N/A;  . LUMBAR DISC SURGERY     L5-S1    Social History   Socioeconomic History  . Marital status: Married    Spouse name: Not on file  . Number of children: 1  . Years of education: Not on file  . Highest education level: Not on file  Occupational History  . Occupation: Psychiatric nurse  Social Needs  . Financial resource strain: Not on file  . Food insecurity:    Worry: Not on file  Inability: Not on file  . Transportation needs:    Medical: Not on file    Non-medical: Not on file  Tobacco Use  . Smoking status: Never Smoker  . Smokeless tobacco: Never Used  Substance and Sexual Activity  . Alcohol use: No  . Drug use: No  . Sexual activity: Not on file  Lifestyle  . Physical activity:    Days per week: Not on file    Minutes per session: Not on file  . Stress: Not on file  Relationships  . Social connections:    Talks on phone: Not on file    Gets together: Not on file    Attends religious service: Not on file    Active member of club or organization: Not on file    Attends meetings of clubs or organizations: Not on file    Relationship status: Not  on file  Other Topics Concern  . 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 Other Father 73       deceased, accidental death  . Other Mother 75       natural causes  . Diabetes Brother   . Kidney disease Brother        ESRD - DM  . Breast cancer Sister   . Colon cancer Neg Hx   . Prostate cancer Neg Hx   . Coronary artery disease Neg Hx   . Heart attack Neg Hx     Review of Systems  Constitutional: Negative for fever.  Respiratory: Negative for cough, shortness of breath and wheezing.   Cardiovascular: Negative for chest pain, palpitations and leg swelling.  Skin: Positive for color change (Toenails).  Neurological: Negative for light-headedness, numbness and headaches.       Objective:   Vitals:   05/26/18 1058  BP: 136/82  Pulse: 72  Resp: 16  Temp: 98.1 F (36.7 C)  SpO2: 93%   BP Readings from Last 3 Encounters:  05/26/18 136/82  11/28/17 122/68  08/01/17 140/60   Wt Readings from Last 3 Encounters:  05/26/18 189 lb 12.8 oz (86.1 kg)  11/28/17 186 lb (84.4 kg)  08/01/17 186 lb (84.4 kg)   Body mass index is 30.63 kg/m.   Physical Exam  Constitutional: He appears well-developed and well-nourished. No distress.  HENT:  Head: Normocephalic and atraumatic.  Neck: Neck supple. No tracheal deviation present. No thyromegaly present.  Cardiovascular: Normal rate, regular rhythm and normal heart sounds.  No murmur heard. Pulmonary/Chest: Effort normal and breath sounds normal. No respiratory distress. He has no wheezes. He has no rales.  Musculoskeletal: He exhibits no edema.  Lymphadenopathy:    He has no cervical adenopathy.  Skin: Skin is warm and dry. He is not diaphoretic.  Bilateral second toenails with ecchymosis, no toenail deformity, no skin erythema, swelling or breaks in skin, nontender          Assessment & Plan:    See Problem List for Assessment and Plan of chronic medical problems.

## 2018-05-26 ENCOUNTER — Encounter: Payer: Self-pay | Admitting: Internal Medicine

## 2018-05-26 ENCOUNTER — Other Ambulatory Visit (INDEPENDENT_AMBULATORY_CARE_PROVIDER_SITE_OTHER): Payer: BLUE CROSS/BLUE SHIELD

## 2018-05-26 ENCOUNTER — Ambulatory Visit: Payer: BLUE CROSS/BLUE SHIELD | Admitting: Internal Medicine

## 2018-05-26 VITALS — BP 136/82 | HR 72 | Temp 98.1°F | Resp 16 | Ht 66.0 in | Wt 189.8 lb

## 2018-05-26 DIAGNOSIS — E7800 Pure hypercholesterolemia, unspecified: Secondary | ICD-10-CM

## 2018-05-26 DIAGNOSIS — S90229A Contusion of unspecified lesser toe(s) with damage to nail, initial encounter: Secondary | ICD-10-CM | POA: Diagnosis not present

## 2018-05-26 DIAGNOSIS — I2510 Atherosclerotic heart disease of native coronary artery without angina pectoris: Secondary | ICD-10-CM

## 2018-05-26 DIAGNOSIS — E559 Vitamin D deficiency, unspecified: Secondary | ICD-10-CM | POA: Insufficient documentation

## 2018-05-26 DIAGNOSIS — R7303 Prediabetes: Secondary | ICD-10-CM | POA: Diagnosis not present

## 2018-05-26 DIAGNOSIS — E782 Mixed hyperlipidemia: Secondary | ICD-10-CM

## 2018-05-26 LAB — LIPID PANEL
CHOLESTEROL: 132 mg/dL (ref 0–200)
HDL: 41.3 mg/dL (ref >39.00)
LDL Cholesterol: 72 mg/dL (ref 0–99)
NONHDL: 91.11
Total CHOL/HDL Ratio: 3
Triglycerides: 94 mg/dL (ref 0.0–149.0)
VLDL: 18.8 mg/dL (ref 0.0–40.0)

## 2018-05-26 LAB — COMPREHENSIVE METABOLIC PANEL
ALBUMIN: 4.3 g/dL (ref 3.5–5.2)
ALT: 23 U/L (ref 0–53)
AST: 15 U/L (ref 0–37)
Alkaline Phosphatase: 58 U/L (ref 39–117)
BILIRUBIN TOTAL: 0.4 mg/dL (ref 0.2–1.2)
BUN: 16 mg/dL (ref 6–23)
CALCIUM: 9.8 mg/dL (ref 8.4–10.5)
CO2: 27 meq/L (ref 19–32)
CREATININE: 0.73 mg/dL (ref 0.40–1.50)
Chloride: 104 mEq/L (ref 96–112)
GFR: 116.46 mL/min (ref >60.00)
Glucose, Bld: 114 mg/dL — ABNORMAL HIGH (ref 70–99)
Potassium: 4.3 mEq/L (ref 3.5–5.1)
Sodium: 142 mEq/L (ref 135–145)
TOTAL PROTEIN: 7.6 g/dL (ref 6.0–8.3)

## 2018-05-26 LAB — HEMOGLOBIN A1C: Hgb A1c MFr Bld: 6.4 % (ref 4.6–6.5)

## 2018-05-26 LAB — VITAMIN D 25 HYDROXY (VIT D DEFICIENCY, FRACTURES): VITD: 31.31 ng/mL (ref 30.00–100.00)

## 2018-05-26 MED ORDER — VITAMIN D3 75 MCG (3000 UT) PO TABS: 3000.0000 [IU] | ORAL_TABLET | Freq: Every day | ORAL | Status: DC

## 2018-05-26 NOTE — Assessment & Plan Note (Signed)
No chest pain, palpitations, shortness of breath Continue statin Will check CMP, statin a couple of months

## 2018-05-26 NOTE — Patient Instructions (Signed)
Have blood work done in about 3 months.   Take 3000 units of vitamin D daily.     Try getting a slightly larger shoe.

## 2018-05-26 NOTE — Assessment & Plan Note (Signed)
Continue statin Check lipid panel, CMP in couple of months Continue regular exercise

## 2018-05-26 NOTE — Assessment & Plan Note (Signed)
He has bruising of his bilateral second toenails, which started a few weeks ago.  It started after increasing the amount he was walking Advised him to get size 9 sneakers-his toenails are pushing on the shoe, which is causing bruising/contusion and discomfort

## 2018-05-26 NOTE — Assessment & Plan Note (Signed)
Check a1c Low sugar / carb diet Stressed regular exercise Decrease portions to help with weight loss

## 2018-05-27 ENCOUNTER — Encounter: Payer: Self-pay | Admitting: Internal Medicine

## 2018-08-02 ENCOUNTER — Telehealth: Payer: Self-pay | Admitting: Cardiovascular Disease

## 2018-08-02 NOTE — Telephone Encounter (Signed)
° ° °  Patient requesting order for fasting, annual labs to be done on 11/21

## 2018-08-02 NOTE — Telephone Encounter (Signed)
Informed patient his lab work was drawn in September and he does not need to come fasting tomorrow. Reiterated to him that Dr. Burt Knack will order further labs if he sees necessary. He was grateful for call.

## 2018-08-03 ENCOUNTER — Encounter: Payer: Self-pay | Admitting: Cardiovascular Disease

## 2018-08-03 ENCOUNTER — Ambulatory Visit: Payer: BLUE CROSS/BLUE SHIELD | Admitting: Cardiovascular Disease

## 2018-08-03 VITALS — BP 118/76 | HR 68 | Ht 66.0 in | Wt 187.0 lb

## 2018-08-03 DIAGNOSIS — E782 Mixed hyperlipidemia: Secondary | ICD-10-CM | POA: Diagnosis not present

## 2018-08-03 DIAGNOSIS — I25119 Atherosclerotic heart disease of native coronary artery with unspecified angina pectoris: Secondary | ICD-10-CM | POA: Diagnosis not present

## 2018-08-03 NOTE — Patient Instructions (Signed)

## 2018-08-03 NOTE — Progress Notes (Signed)
Cardiology Office Note:    Date:  08/03/2018   ID:  Frank Matthews, DOB 12-Oct-1955, MRN [id number]  PCP:  Binnie Rail, MD  Cardiologist:  No primary care provider on file.  Electrophysiologist:  None   Referring MD: Binnie Rail, MD   Chief Complaint  Patient presents with  . Coronary Artery Disease    History of Present Illness:    Frank Matthews is a 62 y.o. male with a hx of CAD presenting for follow-up evaluation.   He initially presented in 2014 with typical symptoms of exertional angina. He underwent cardiac catheterization demonstrating severe stenosis of a diagonal branch and total occlusion of a small diagonal subbranch. He had no coronary obstruction in his major coronary branches. Medical therapy was recommended. He was started on a beta blocker at that time.  He remains physically active - had to slow down a little over the past month because of problems with his feet. Prior to this he was walking 5 miles/day. Today, he denies symptoms of palpitations, chest pain, shortness of breath, orthopnea, PND, lower extremity edema, dizziness, or syncope.   Past Medical History:  Diagnosis Date  . H/O idiopathic seizure   . Mediterranean fever   . Seizures ([place]) 4-5 years   had one time seizure, none since then per pt.    Past Surgical History:  Procedure Laterality Date  . LEFT HEART CATHETERIZATION WITH CORONARY ANGIOGRAM N/A 07/23/2013   Procedure: LEFT HEART CATHETERIZATION WITH CORONARY ANGIOGRAM;  Surgeon: Blane Ohara, MD;  Location: [facility] CATH LAB;  Service: Cardiovascular;  Laterality: N/A;  . LUMBAR DISC SURGERY     L5-S1    Current Medications: Current Meds  Medication Sig  . aspirin 81 MG tablet Take 1 tablet (81 mg total) by mouth daily.  Marland Kitchen atorvastatin (LIPITOR) 10 MG tablet TAKE 1 TABLET (10 MG TOTAL) BY MOUTH DAILY AT 6 PM.  . Cholecalciferol (VITAMIN D3) 3000 units TABS Take 3,000 Units by mouth daily.  Marland Kitchen gabapentin (NEURONTIN) 100 MG capsule  TAKE 2 CAPSULES (200 MG TOTAL) BY MOUTH AT BEDTIME.  . metoprolol succinate (TOPROL-XL) 25 MG 24 hr tablet TAKE 0.5 TABLETS (12.5 MG TOTAL) BY MOUTH AT BEDTIME.     Allergies:   Patient has no known allergies.   Social History   Socioeconomic History  . Marital status: Married    Spouse name: Not on file  . Number of children: 1  . Years of education: Not on file  . Highest education level: Not on file  Occupational History  . Occupation: Psychiatric nurse  Social Needs  . Financial resource strain: Not on file  . Food insecurity:    Worry: Not on file    Inability: Not on file  . Transportation needs:    Medical: Not on file    Non-medical: Not on file  Tobacco Use  . Smoking status: Never Smoker  . Smokeless tobacco: Never Used  Substance and Sexual Activity  . Alcohol use: No  . Drug use: No  . Sexual activity: Not on file  Lifestyle  . Physical activity:    Days per week: Not on file    Minutes per session: Not on file  . Stress: Not on file  Relationships  . Social connections:    Talks on phone: Not on file    Gets together: Not on file    Attends religious service: Not on file    Active member of club or organization: Not on  file    Attends meetings of clubs or organizations: Not on file    Relationship status: Not on file  Other Topics Concern  . Not on file  Social History Narrative   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, quantity   Married '91   1 daughter - '96   Work - Psychiatric nurse     Family History: The patient's family history includes Breast cancer in his sister; Diabetes in his brother; Kidney disease in his brother; Other (age of onset: 65) in his father; Other (age of onset: 77) in his mother. There is no history of Colon cancer, Prostate cancer, Coronary artery disease, or Heart attack.  ROS:   Please see the history of present illness.    Positive for back pain, snoring.  All other systems reviewed and are negative.  EKGs/Labs/Other Studies Reviewed:     EKG:  EKG is  ordered today.  The ekg ordered today demonstrates NSR, incomplete RBBB, moderate criteria for LVH may be normal variant.   Recent Labs: 11/30/2017: Hemoglobin 14.1; Platelets 257.0; TSH 1.50 05/26/2018: ALT 23; BUN 16; Creatinine, Ser 0.73; Potassium 4.3; Sodium 142  Recent Lipid Panel    Component Value Date/Time   CHOL 132 05/26/2018 1133   CHOL 131 07/29/2017 1014   TRIG 94.0 05/26/2018 1133   HDL 41.30 05/26/2018 1133   HDL 41 07/29/2017 1014   CHOLHDL 3 05/26/2018 1133   VLDL 18.8 05/26/2018 1133   LDLCALC 72 05/26/2018 1133   LDLCALC 74 07/29/2017 1014   LDLDIRECT 139.2 07/12/2013 1639    Physical Exam:    VS:  BP 118/76   Pulse 68   Ht 5\' 6"  (1.676 m)   Wt 187 lb (84.8 kg)   SpO2 91%   BMI 30.18 kg/m     Wt Readings from Last 3 Encounters:  08/03/18 187 lb (84.8 kg)  05/26/18 189 lb 12.8 oz (86.1 kg)  11/28/17 186 lb (84.4 kg)     GEN:  Well nourished, well developed in no acute distress HEENT: Normal NECK: No JVD; No carotid bruits LYMPHATICS: No lymphadenopathy CARDIAC: RRR, no murmurs, rubs, gallops RESPIRATORY:  Clear to auscultation without rales, wheezing or rhonchi  ABDOMEN: Soft, non-tender, non-distended MUSCULOSKELETAL:  No edema; No deformity  SKIN: Warm and dry NEUROLOGIC:  Alert and oriented x 3 PSYCHIATRIC:  Normal affect   ASSESSMENT:    1. Coronary artery disease involving native coronary artery of native heart with angina pectoris ([place])   2. Mixed hyperlipidemia    PLAN:    In order of problems listed above:  1. Stable on combination of ASA, a beta blocker, and statin drug. Good functional capacity with no exertional symptoms. 2. Lipids reviewed - LDL 72 mg/dL. Continue current Rx. Dosing limited by myalgias and he is doing better on a lower dose of atorvastatin.   Overall he is doing well and I will plan to see him back in one year unless problems arise in the interim.   Medication Adjustments/Labs and Tests  Ordered: Current medicines are reviewed at length with the patient today.  Concerns regarding medicines are outlined above.  Orders Placed This Encounter  Procedures  . EKG 12-Lead   No orders of the defined types were placed in this encounter.   Patient Instructions  Medication Instructions:  Your provider recommends that you continue on your current medications as directed. Please refer to the Current Medication list given to you today.    Labwork: None  Testing/Procedures: None  Follow-Up: Your provider wants  you to follow-up in: 1 year with Dr. Burt Knack. You will receive a reminder letter in the mail two months in advance. If you don't receive a letter, please call our office to schedule the follow-up appointment.    Any Other Special Instructions Will Be Listed Below (If Applicable).     If you need a refill on your cardiac medications before your next appointment, please call your pharmacy.      Signed, Sherren Mocha, MD  08/03/2018 5:36 PM    [place]

## 2018-08-29 ENCOUNTER — Ambulatory Visit: Payer: BLUE CROSS/BLUE SHIELD | Admitting: Physician Assistant

## 2018-09-12 ENCOUNTER — Other Ambulatory Visit (INDEPENDENT_AMBULATORY_CARE_PROVIDER_SITE_OTHER): Payer: BLUE CROSS/BLUE SHIELD

## 2018-09-12 ENCOUNTER — Other Ambulatory Visit: Payer: Self-pay

## 2018-09-12 DIAGNOSIS — E7800 Pure hypercholesterolemia, unspecified: Secondary | ICD-10-CM

## 2018-09-12 DIAGNOSIS — I2510 Atherosclerotic heart disease of native coronary artery without angina pectoris: Secondary | ICD-10-CM

## 2018-09-12 LAB — COMPREHENSIVE METABOLIC PANEL
ALBUMIN: 4.4 g/dL (ref 3.5–5.2)
ALT: 19 U/L (ref 0–53)
AST: 17 U/L (ref 0–37)
Alkaline Phosphatase: 52 U/L (ref 39–117)
BILIRUBIN TOTAL: 0.4 mg/dL (ref 0.2–1.2)
BUN: 20 mg/dL (ref 6–23)
CO2: 30 mEq/L (ref 19–32)
CREATININE: 0.82 mg/dL (ref 0.40–1.50)
Calcium: 9.7 mg/dL (ref 8.4–10.5)
Chloride: 102 mEq/L (ref 96–112)
GFR: 101.73 mL/min (ref >60.00)
Glucose, Bld: 118 mg/dL — ABNORMAL HIGH (ref 70–99)
Potassium: 4.6 mEq/L (ref 3.5–5.1)
Sodium: 139 mEq/L (ref 135–145)
Total Protein: 7.4 g/dL (ref 6.0–8.3)

## 2018-09-12 LAB — LIPID PANEL
CHOLESTEROL: 159 mg/dL (ref 0–200)
HDL: 45.2 mg/dL (ref >39.00)
LDL Cholesterol: 84 mg/dL (ref 0–99)
NonHDL: 113.67
Total CHOL/HDL Ratio: 4
Triglycerides: 150 mg/dL — ABNORMAL HIGH (ref 0.0–149.0)
VLDL: 30 mg/dL (ref 0.0–40.0)

## 2018-09-13 ENCOUNTER — Encounter: Payer: Self-pay | Admitting: Internal Medicine

## 2018-10-06 ENCOUNTER — Encounter: Payer: Self-pay | Admitting: Internal Medicine

## 2018-10-06 ENCOUNTER — Ambulatory Visit: Payer: BLUE CROSS/BLUE SHIELD | Admitting: Internal Medicine

## 2018-10-06 ENCOUNTER — Telehealth: Payer: Self-pay | Admitting: Internal Medicine

## 2018-10-06 VITALS — BP 120/84 | HR 83 | Temp 97.9°F | Ht 66.0 in | Wt 188.0 lb

## 2018-10-06 DIAGNOSIS — H109 Unspecified conjunctivitis: Secondary | ICD-10-CM | POA: Insufficient documentation

## 2018-10-06 DIAGNOSIS — R221 Localized swelling, mass and lump, neck: Secondary | ICD-10-CM | POA: Diagnosis not present

## 2018-10-06 DIAGNOSIS — R7303 Prediabetes: Secondary | ICD-10-CM

## 2018-10-06 MED ORDER — NEOMYCIN-POLYMYXIN-HC 3.5-10000-1 OP SUSP: 4.0000 [drp] | Freq: Four times a day (QID) | OPHTHALMIC | 0 refills | Status: AC

## 2018-10-06 MED ORDER — AMOXICILLIN-POT CLAVULANATE 875-125 MG PO TABS: 1.0000 | ORAL_TABLET | Freq: Two times a day (BID) | ORAL | 0 refills | Status: DC

## 2018-10-06 MED ORDER — ERYTHROMYCIN 5 MG/GM OP OINT: 1.0000 "application " | TOPICAL_OINTMENT | Freq: Four times a day (QID) | OPHTHALMIC | 1 refills | Status: AC

## 2018-10-06 NOTE — Addendum Note (Signed)
Addended by: Biagio Borg on: 10/06/2018 01:18 PM   Modules accepted: Orders

## 2018-10-06 NOTE — Assessment & Plan Note (Signed)
Possible infectious related but not obvious by exam, for augmentin trial as well, but consider ENT if not improved in 3-5 days

## 2018-10-06 NOTE — Patient Instructions (Signed)
Please take all new medication as prescribed - the eye drops (antibiotic), and pill antibiotic  Please continue all other medications as before, and refills have been done if requested.  Please have the pharmacy call with any other refills you may need.  Please keep your appointments with your specialists as you may have planned  Please call or return by mid week (next week) if the right face swelling area is not resolved, to consider a referral to Dr Elwyn Reach

## 2018-10-06 NOTE — Assessment & Plan Note (Signed)
stable overall by history and exam, recent data reviewed with pt, and pt to continue medical treatment as before,  to f/u any worsening symptoms or concerns  

## 2018-10-06 NOTE — Progress Notes (Signed)
Subjective:    Patient ID: Frank Matthews, male    DOB: 08-Oct-1955, 63 y.o.   MRN: [id number]  HPI  Here to f/u with c/o bilat eye red, swelling weepiness associated with rather large left lower lid sweling, but little pain, more itchy at time, but not better with zatidor OTC on advice of a MD in the family.  Denies HA, Ear pain, ST, fever, chills, or cough and Pt denies chest pain, increased sob or doe, wheezing, orthopnea, PND, increased LE swelling, palpitations, dizziness or syncope. Did however develop an unusual right facial swelling mid last wk with rather large swelling now improved, mild only, and again not assoc with fever and very little if any pain or soreness to touch.   Pt denies polydipsia, polyuria, or low sugar symptoms such as weakness or confusion improved with po intake.  Pt states overall good compliance with meds, trying to follow lower cholesterol, diabetic diet, wt overall stable but little exercise however.    Past Medical History:  Diagnosis Date  . H/O idiopathic seizure   . Mediterranean fever   . Seizures ([place]) 4-5 years   had one time seizure, none since then per pt.   Past Surgical History:  Procedure Laterality Date  . LEFT HEART CATHETERIZATION WITH CORONARY ANGIOGRAM N/A 07/23/2013   Procedure: LEFT HEART CATHETERIZATION WITH CORONARY ANGIOGRAM;  Surgeon: Blane Ohara, MD;  Location: [facility] CATH LAB;  Service: Cardiovascular;  Laterality: N/A;  . LUMBAR DISC SURGERY     L5-S1    reports that he has never smoked. He has never used smokeless tobacco. He reports that he does not drink alcohol or use drugs. family history includes Breast cancer in his sister; Diabetes in his brother; Kidney disease in his brother; Other (age of onset: 50) in his father; Other (age of onset: 10) in his mother. No Known Allergies Current Outpatient Medications on File Prior to Visit  Medication Sig Dispense Refill  . aspirin 81 MG tablet Take 1 tablet (81 mg total) by mouth  daily.    Marland Kitchen atorvastatin (LIPITOR) 10 MG tablet TAKE 1 TABLET (10 MG TOTAL) BY MOUTH DAILY AT 6 PM. 90 tablet 2  . Cholecalciferol (VITAMIN D3) 3000 units TABS Take 3,000 Units by mouth daily. 30 tablet   . gabapentin (NEURONTIN) 100 MG capsule TAKE 2 CAPSULES (200 MG TOTAL) BY MOUTH AT BEDTIME. 180 capsule 3  . metoprolol succinate (TOPROL-XL) 25 MG 24 hr tablet TAKE 0.5 TABLETS (12.5 MG TOTAL) BY MOUTH AT BEDTIME. 45 tablet 2   No current facility-administered medications on file prior to visit.    Review of Systems  Constitutional: Negative for other unusual diaphoresis or sweats HENT: Negative for ear discharge or swelling Eyes: Negative for other worsening visual disturbances Respiratory: Negative for stridor or other swelling  Gastrointestinal: Negative for worsening distension or other blood Genitourinary: Negative for retention or other urinary change Musculoskeletal: Negative for other MSK pain or swelling Skin: Negative for color change or other new lesions Neurological: Negative for worsening tremors and other numbness  Psychiatric/Behavioral: Negative for worsening agitation or other fatigue All other system neg per pt    Objective:   Physical Exam BP 120/84   Pulse 83   Temp 97.9 F (36.6 C) (Oral)   Ht 5\' 6"  (1.676 m)   Wt 188 lb (85.3 kg)   SpO2 92%   BMI 30.34 kg/m  VS noted, non toxic, not even overly ill appearing Constitutional: Pt appears in NAD  HENT: Head: NCAT.  Right Ear: External ear normal.  Left Ear: External ear normal.  Eyes: . Pupils are equal, round, and reactive to light. Conjunctivae with marked bilat eyrthema, weepiness, and EOM are normal, and left lower eyelid with 1 cm area of mild to mod swelling that are minimally tender Bilat tm's with mild erythema.  Max sinus areas non tender.  Pharynx with mild erythema, no exudate Nose: without d/c or deformity Neck: Neck supple. Gross normal ROM but with right facial and right submandibular  adenopathy that seems firm or hard, some fixed, minimal tender if at all Cardiovascular: Normal rate and regular rhythm.   Pulmonary/Chest: Effort normal and breath sounds without rales or wheezing.  Neurological: Pt is alert. At baseline orientation, motor grossly intact Skin: Skin is warm. No rashes, other new lesions, no LE edema Psychiatric: Pt behavior is normal without agitation  No other exam findings Lab Results  Component Value Date   WBC 8.6 11/30/2017   HGB 14.1 11/30/2017   HCT 42.1 11/30/2017   PLT 257.0 11/30/2017   GLUCOSE 118 (H) 09/12/2018   CHOL 159 09/12/2018   TRIG 150.0 (H) 09/12/2018   HDL 45.20 09/12/2018   LDLDIRECT 139.2 07/12/2013   LDLCALC 84 09/12/2018   ALT 19 09/12/2018   AST 17 09/12/2018   NA 139 09/12/2018   K 4.6 09/12/2018   CL 102 09/12/2018   CREATININE 0.82 09/12/2018   BUN 20 09/12/2018   CO2 30 09/12/2018   TSH 1.50 11/30/2017   PSA 0.22 11/28/2015   INR 1.1 (H) 07/20/2013   HGBA1C 6.4 05/26/2018          Assessment & Plan:

## 2018-10-06 NOTE — Telephone Encounter (Signed)
Pt saw Dr. Jenny Reichmann for this issue. Rx was prescribed by him. Please advise.

## 2018-10-06 NOTE — Assessment & Plan Note (Signed)
Mild to mod, for antibx course,  to f/u any worsening symptoms or concerns 

## 2018-10-06 NOTE — Telephone Encounter (Signed)
Copied from [place] [id number]. Topic: Quick Communication - See Telephone Encounter >> Oct 06, 2018 11:37 AM Blase Mess A wrote: CRM for notification. See Telephone encounter for: 10/06/18.  Caryl Pina calling from [id number] - [place], [place] [phone] (Phone) [phone] (Fax)  Calling regarding neomycin-polymyxin-hydrocortisone (CORTISPORIN) 3.5-10000-1 ophthalmic suspension [[id number]] the medication is not available. Can to see if the doctor can send another script in for something else.

## 2018-10-06 NOTE — Telephone Encounter (Signed)
Done erx 

## 2018-10-09 ENCOUNTER — Other Ambulatory Visit: Payer: Self-pay | Admitting: Internal Medicine

## 2018-10-31 ENCOUNTER — Other Ambulatory Visit: Payer: Self-pay | Admitting: Internal Medicine

## 2018-10-31 DIAGNOSIS — E785 Hyperlipidemia, unspecified: Secondary | ICD-10-CM

## 2018-12-12 ENCOUNTER — Encounter

## 2019-01-22 ENCOUNTER — Other Ambulatory Visit: Payer: Self-pay | Admitting: Internal Medicine

## 2019-01-22 DIAGNOSIS — E785 Hyperlipidemia, unspecified: Secondary | ICD-10-CM

## 2019-01-22 MED ORDER — VITAMIN D3 75 MCG (3000 UT) PO TABS: 3000.0000 [IU] | ORAL_TABLET | Freq: Every day | ORAL | 0 refills | Status: DC

## 2019-01-22 MED ORDER — ATORVASTATIN CALCIUM 10 MG PO TABS: 10.0000 mg | ORAL_TABLET | Freq: Every day | ORAL | 0 refills | Status: DC

## 2019-01-22 MED ORDER — METOPROLOL SUCCINATE ER 25 MG PO TB24: ORAL_TABLET | ORAL | 0 refills | Status: DC

## 2019-01-28 ENCOUNTER — Other Ambulatory Visit: Payer: Self-pay | Admitting: Internal Medicine

## 2019-01-28 DIAGNOSIS — E785 Hyperlipidemia, unspecified: Secondary | ICD-10-CM

## 2019-02-15 DIAGNOSIS — Z20828 Contact with and (suspected) exposure to other viral communicable diseases: Secondary | ICD-10-CM | POA: Diagnosis not present

## 2019-02-21 ENCOUNTER — Other Ambulatory Visit: Payer: Self-pay | Admitting: Internal Medicine

## 2019-02-21 DIAGNOSIS — E785 Hyperlipidemia, unspecified: Secondary | ICD-10-CM

## 2019-03-24 ENCOUNTER — Other Ambulatory Visit: Payer: Self-pay | Admitting: Internal Medicine

## 2019-03-24 DIAGNOSIS — E785 Hyperlipidemia, unspecified: Secondary | ICD-10-CM

## 2019-03-26 ENCOUNTER — Other Ambulatory Visit: Payer: Self-pay | Admitting: Internal Medicine

## 2019-03-26 DIAGNOSIS — E785 Hyperlipidemia, unspecified: Secondary | ICD-10-CM

## 2019-04-12 ENCOUNTER — Other Ambulatory Visit: Payer: Self-pay | Admitting: Internal Medicine

## 2019-04-12 DIAGNOSIS — E785 Hyperlipidemia, unspecified: Secondary | ICD-10-CM

## 2019-04-12 MED ORDER — ATORVASTATIN CALCIUM 10 MG PO TABS: 10.0000 mg | ORAL_TABLET | Freq: Every day | ORAL | 0 refills | Status: DC

## 2019-04-21 ENCOUNTER — Other Ambulatory Visit: Payer: Self-pay | Admitting: Internal Medicine

## 2019-04-27 ENCOUNTER — Other Ambulatory Visit: Payer: Self-pay | Admitting: Internal Medicine

## 2019-04-30 NOTE — Patient Instructions (Addendum)
  Tests ordered today. Your results will be released to MyChart (or called to you) after review.  If any changes need to be made, you will be notified at that same time.   Medications reviewed and updated.  Changes include :   none  Your prescription(s) have been submitted to your pharmacy. Please take as directed and contact our office if you believe you are having problem(s) with the medication(s).    Please followup in 6 months   

## 2019-04-30 NOTE — Progress Notes (Signed)
Subjective:    Patient ID: Frank Matthews, male    DOB: [birth year], 63 y.o.   MRN: [id number]  HPI The patient is here for follow up.  He is not exercising regularly.   He was going to the gym before the [place] pandemic, but has not been exercising regularly since.  CAD, Hypertension: He is taking his medication daily. He is compliant with a low sodium diet.  He denies chest pain, palpitations, edema, shortness of breath and regular headaches.     Hyperlipidemia: He is taking his medication daily. He is compliant with a low fat/cholesterol diet. He denies myalgias.     Prediabetes:  He is compliant with a low sugar/carbohydrate diet.  He is not exercising regularly.  Vitamin d def:  He is taking vitamin d daily.    Chronic lower back pain:  He takes gabapentin at night and it helps.  The pain is mild and chronic.  When he was exercising regularly it did help.  He feels his pain is fairly controlled.   Medications and allergies reviewed with patient and updated if appropriate.  Patient Active Problem List   Diagnosis Date Noted  . Mass of right side of neck 10/06/2018  . Vitamin D deficiency 05/26/2018  . Contusion of toenail 05/26/2018  . Spinal stenosis, lumbar region with neurogenic claudication 12/09/2016  . Chronic lower back pain 07/06/2016  . Muscle cramping 07/06/2016  . Prediabetes 11/30/2015  . CAD (coronary artery disease) 07/15/2013  . Other malaise and fatigue 12/04/2011  . Hyperlipidemia 12/02/2011  . Microscopic hematuria 11/09/2008    Current Outpatient Medications on File Prior to Visit  Medication Sig Dispense Refill  . aspirin 81 MG tablet Take 1 tablet (81 mg total) by mouth daily.    Marland Kitchen atorvastatin (LIPITOR) 10 MG tablet Take 1 tablet (10 mg total) by mouth daily at 6 PM. Need an office visit for more refills. 30 tablet 0  . gabapentin (NEURONTIN) 100 MG capsule TAKE 2 CAPSULES (200 MG TOTAL) BY MOUTH AT BEDTIME. 180 capsule 3  . metoprolol succinate  (TOPROL-XL) 25 MG 24 hr tablet Take 1/2 tablet (12.5 mg total) by mouth at bedtime. Need office visit for more refills 45 tablet 0   No current facility-administered medications on file prior to visit.     Past Medical History:  Diagnosis Date  . H/O idiopathic seizure   . Mediterranean fever   . Seizures ([place]) 4-5 years   had one time seizure, none since then per pt.    Past Surgical History:  Procedure Laterality Date  . LEFT HEART CATHETERIZATION WITH CORONARY ANGIOGRAM N/A 07/23/2013   Procedure: LEFT HEART CATHETERIZATION WITH CORONARY ANGIOGRAM;  Surgeon: Blane Ohara, MD;  Location: [facility] CATH LAB;  Service: Cardiovascular;  Laterality: N/A;  . LUMBAR DISC SURGERY     L5-S1    Social History   Socioeconomic History  . Marital status: Married    Spouse name: Not on file  . Number of children: 1  . Years of education: Not on file  . Highest education level: Not on file  Occupational History  . Occupation: Psychiatric nurse  Social Needs  . Financial resource strain: Not on file  . Food insecurity    Worry: Not on file    Inability: Not on file  . Transportation needs    Medical: Not on file    Non-medical: Not on file  Tobacco Use  . Smoking status: Never Smoker  . Smokeless tobacco:  Never Used  Substance and Sexual Activity  . Alcohol use: No  . Drug use: No  . Sexual activity: Not on file  Lifestyle  . Physical activity    Days per week: Not on file    Minutes per session: Not on file  . Stress: Not on file  Relationships  . Social Herbalist on phone: Not on file    Gets together: Not on file    Attends religious service: Not on file    Active member of club or organization: Not on file    Attends meetings of clubs or organizations: Not on file    Relationship status: Not on file  Other Topics Concern  . 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 Other Father 14       deceased, accidental death  . Other Mother 73       natural causes  . Diabetes Brother   . Kidney disease Brother        ESRD - DM  . Breast cancer Sister   . Colon cancer Neg Hx   . Prostate cancer Neg Hx   . Coronary artery disease Neg Hx   . Heart attack Neg Hx     Review of Systems  Constitutional: Negative for chills and fever.  Respiratory: Negative for cough, shortness of breath and wheezing.   Cardiovascular: Negative for chest pain, palpitations and leg swelling.  Gastrointestinal: Negative for abdominal pain.  Musculoskeletal: Positive for back pain. Negative for myalgias.  Neurological: Negative for light-headedness and headaches.       Objective:   Vitals:   05/01/19 1509  BP: 140/86  Pulse: 78  Resp: 16  Temp: 98.5 F (36.9 C)  SpO2: 96%   BP Readings from Last 3 Encounters:  05/01/19 140/86  10/06/18 120/84  08/03/18 118/76   Wt Readings from Last 3 Encounters:  05/01/19 189 lb 12.8 oz (86.1 kg)  10/06/18 188 lb (85.3 kg)  08/03/18 187 lb (84.8 kg)   Body mass index is 30.63 kg/m.   Physical Exam    Constitutional: Appears well-developed and well-nourished. No distress.  HENT:  Head: Normocephalic and atraumatic.  Neck: Neck supple. No tracheal deviation present. No thyromegaly present.  No cervical lymphadenopathy Cardiovascular: Normal rate, regular rhythm and normal heart sounds.   No murmur heard. No carotid bruit .  No edema Pulmonary/Chest: Effort normal and breath sounds normal. No respiratory distress. No has no wheezes. No rales.  Skin: Skin is warm and dry. Not diaphoretic.  Psychiatric: Normal mood and affect. Behavior is normal.      Assessment & Plan:    See Problem List for Assessment and Plan of chronic medical problems.

## 2019-05-01 ENCOUNTER — Other Ambulatory Visit: Payer: Self-pay

## 2019-05-01 ENCOUNTER — Encounter: Payer: Self-pay | Admitting: Internal Medicine

## 2019-05-01 ENCOUNTER — Other Ambulatory Visit: Payer: Self-pay | Admitting: Internal Medicine

## 2019-05-01 ENCOUNTER — Ambulatory Visit (INDEPENDENT_AMBULATORY_CARE_PROVIDER_SITE_OTHER): Payer: BC Managed Care – PPO | Admitting: Internal Medicine

## 2019-05-01 VITALS — BP 140/86 | HR 78 | Temp 98.5°F | Resp 16 | Ht 66.0 in | Wt 189.8 lb

## 2019-05-01 DIAGNOSIS — M545 Low back pain: Secondary | ICD-10-CM

## 2019-05-01 DIAGNOSIS — E559 Vitamin D deficiency, unspecified: Secondary | ICD-10-CM

## 2019-05-01 DIAGNOSIS — E782 Mixed hyperlipidemia: Secondary | ICD-10-CM | POA: Diagnosis not present

## 2019-05-01 DIAGNOSIS — R7303 Prediabetes: Secondary | ICD-10-CM

## 2019-05-01 DIAGNOSIS — G8929 Other chronic pain: Secondary | ICD-10-CM

## 2019-05-01 DIAGNOSIS — Z125 Encounter for screening for malignant neoplasm of prostate: Secondary | ICD-10-CM

## 2019-05-01 DIAGNOSIS — I2510 Atherosclerotic heart disease of native coronary artery without angina pectoris: Secondary | ICD-10-CM | POA: Diagnosis not present

## 2019-05-01 DIAGNOSIS — E785 Hyperlipidemia, unspecified: Secondary | ICD-10-CM

## 2019-05-01 MED ORDER — GABAPENTIN 100 MG PO CAPS: 200.0000 mg | ORAL_CAPSULE | Freq: Every day | ORAL | 1 refills | Status: DC

## 2019-05-01 MED ORDER — ATORVASTATIN CALCIUM 10 MG PO TABS: 10.0000 mg | ORAL_TABLET | Freq: Every day | ORAL | 1 refills | Status: DC

## 2019-05-01 MED ORDER — METOPROLOL SUCCINATE ER 25 MG PO TB24: ORAL_TABLET | ORAL | 1 refills | Status: DC

## 2019-05-01 NOTE — Assessment & Plan Note (Signed)
Check a1c Low sugar / carb diet Stressed regular exercise   

## 2019-05-01 NOTE — Assessment & Plan Note (Signed)
Taking gabapentin at night, which is helping with his pain-continue Has some mild chronic lower back pain Restart regular exercise

## 2019-05-01 NOTE — Assessment & Plan Note (Signed)
Following with cardiology No concerning symptoms of angina Taking aspirin 81 mg daily and atorvastatin 10 mg daily-he did not tolerate a higher dose due to myalgias Check lipid panel, CMP Stressed restarting regular exercise

## 2019-05-01 NOTE — Assessment & Plan Note (Signed)
Taking vitamin D daily Will check level

## 2019-05-01 NOTE — Assessment & Plan Note (Signed)
Taking atorvastatin 10 mg daily-did not tolerate a higher dose due to myalgias We will check lipid panel, CMP Stressed the importance of regular exercise

## 2019-05-02 ENCOUNTER — Other Ambulatory Visit (INDEPENDENT_AMBULATORY_CARE_PROVIDER_SITE_OTHER): Payer: BC Managed Care – PPO

## 2019-05-02 DIAGNOSIS — I2510 Atherosclerotic heart disease of native coronary artery without angina pectoris: Secondary | ICD-10-CM | POA: Diagnosis not present

## 2019-05-02 DIAGNOSIS — E559 Vitamin D deficiency, unspecified: Secondary | ICD-10-CM

## 2019-05-02 DIAGNOSIS — E782 Mixed hyperlipidemia: Secondary | ICD-10-CM | POA: Diagnosis not present

## 2019-05-02 DIAGNOSIS — Z125 Encounter for screening for malignant neoplasm of prostate: Secondary | ICD-10-CM

## 2019-05-02 DIAGNOSIS — R7303 Prediabetes: Secondary | ICD-10-CM | POA: Diagnosis not present

## 2019-05-02 LAB — COMPREHENSIVE METABOLIC PANEL
ALT: 26 U/L (ref 0–53)
AST: 17 U/L (ref 0–37)
Albumin: 4.5 g/dL (ref 3.5–5.2)
Alkaline Phosphatase: 55 U/L (ref 39–117)
BUN: 18 mg/dL (ref 6–23)
CO2: 29 mEq/L (ref 19–32)
Calcium: 9.7 mg/dL (ref 8.4–10.5)
Chloride: 102 mEq/L (ref 96–112)
Creatinine, Ser: 0.72 mg/dL (ref 0.40–1.50)
GFR: 110.98 mL/min (ref >60.00)
Glucose, Bld: 124 mg/dL — ABNORMAL HIGH (ref 70–99)
Potassium: 4.3 mEq/L (ref 3.5–5.1)
Sodium: 139 mEq/L (ref 135–145)
Total Bilirubin: 0.4 mg/dL (ref 0.2–1.2)
Total Protein: 7.5 g/dL (ref 6.0–8.3)

## 2019-05-02 LAB — CBC WITH DIFFERENTIAL/PLATELET
Basophils Absolute: 0 10*3/uL (ref 0.0–0.1)
Basophils Relative: 0.5 % (ref 0.0–3.0)
Eosinophils Absolute: 0.4 10*3/uL (ref 0.0–0.7)
Eosinophils Relative: 4.5 % (ref 0.0–5.0)
HCT: 43 % (ref 39.0–52.0)
Hemoglobin: 14.6 g/dL (ref 13.0–17.0)
Lymphocytes Relative: 27.8 % (ref 12.0–46.0)
Lymphs Abs: 2.2 10*3/uL (ref 0.7–4.0)
MCHC: 33.9 g/dL (ref 30.0–36.0)
MCV: 86.3 fl (ref 78.0–100.0)
Monocytes Absolute: 0.5 10*3/uL (ref 0.1–1.0)
Monocytes Relative: 6.9 % (ref 3.0–12.0)
Neutro Abs: 4.8 10*3/uL (ref 1.4–7.7)
Neutrophils Relative %: 60.3 % (ref 43.0–77.0)
Platelets: 212 10*3/uL (ref 150.0–400.0)
RBC: 4.99 Mil/uL (ref 4.22–5.81)
RDW: 13.5 % (ref 11.5–15.5)
WBC: 8 10*3/uL (ref 4.0–10.5)

## 2019-05-02 LAB — LIPID PANEL
Cholesterol: 146 mg/dL (ref 0–200)
HDL: 40.7 mg/dL (ref >39.00)
LDL Cholesterol: 81 mg/dL (ref 0–99)
NonHDL: 105.34
Total CHOL/HDL Ratio: 4
Triglycerides: 124 mg/dL (ref 0.0–149.0)
VLDL: 24.8 mg/dL (ref 0.0–40.0)

## 2019-05-02 LAB — HEMOGLOBIN A1C: Hgb A1c MFr Bld: 6.5 % (ref 4.6–6.5)

## 2019-05-02 LAB — VITAMIN D 25 HYDROXY (VIT D DEFICIENCY, FRACTURES): VITD: 55.29 ng/mL (ref 30.00–100.00)

## 2019-05-03 ENCOUNTER — Encounter: Payer: Self-pay | Admitting: Internal Medicine

## 2019-05-03 LAB — PSA, TOTAL AND FREE
PSA, % Free: 50 % (calc) (ref >25)
PSA, Free: 0.1 ng/mL
PSA, Total: 0.2 ng/mL (ref <=4.0)

## 2019-06-24 DIAGNOSIS — Z03818 Encounter for observation for suspected exposure to other biological agents ruled out: Secondary | ICD-10-CM | POA: Diagnosis not present

## 2019-06-24 DIAGNOSIS — Z20828 Contact with and (suspected) exposure to other viral communicable diseases: Secondary | ICD-10-CM | POA: Diagnosis not present

## 2019-06-24 DIAGNOSIS — Z7189 Other specified counseling: Secondary | ICD-10-CM | POA: Diagnosis not present

## 2019-08-23 ENCOUNTER — Other Ambulatory Visit: Payer: Self-pay

## 2019-08-23 ENCOUNTER — Encounter: Payer: Self-pay | Admitting: Cardiovascular Disease

## 2019-08-23 ENCOUNTER — Ambulatory Visit (INDEPENDENT_AMBULATORY_CARE_PROVIDER_SITE_OTHER): Payer: BC Managed Care – PPO | Admitting: Cardiovascular Disease

## 2019-08-23 VITALS — BP 134/82 | HR 72 | Ht 67.0 in | Wt 189.8 lb

## 2019-08-23 DIAGNOSIS — E782 Mixed hyperlipidemia: Secondary | ICD-10-CM | POA: Diagnosis not present

## 2019-08-23 DIAGNOSIS — I2510 Atherosclerotic heart disease of native coronary artery without angina pectoris: Secondary | ICD-10-CM

## 2019-08-23 MED ORDER — ROSUVASTATIN CALCIUM 10 MG PO TABS: 10.0000 mg | ORAL_TABLET | Freq: Every day | ORAL | 3 refills | Status: DC

## 2019-08-23 MED ORDER — METOPROLOL SUCCINATE ER 25 MG PO TB24: 12.5000 mg | ORAL_TABLET | Freq: Every day | ORAL | 3 refills | Status: DC

## 2019-08-23 NOTE — Progress Notes (Signed)
Cardiology Office Note:    Date:  08/24/2019   ID:  HELTON BONSER, DOB 1956/01/24, MRN [id number]  PCP:  Binnie Rail, MD  Cardiologist:  No primary care provider on file.  Electrophysiologist:  None   Referring MD: Binnie Rail, MD   Chief Complaint  Patient presents with  . Coronary Artery Disease   History of Present Illness:    Frank Matthews is a 63 y.o. male with a hx of CAD, presenting for follow-up evaluation.   He initially presented in 2014 with typical symptoms of exertional angina. He underwent cardiac catheterization demonstrating severe stenosis of a diagonal branch and total occlusion of a small diagonal subbranch. He had no coronary obstruction in his major coronary branches. Medical therapy was recommended. He was started on a beta blocker at that time.  The patient is here alone today. He is doing well. Walking regularly without exertional sx's. Today, he denies symptoms of palpitations, chest pain, shortness of breath, orthopnea, PND, lower extremity edema, dizziness, or syncope.  Past Medical History:  Diagnosis Date  . H/O idiopathic seizure   . Mediterranean fever   . Seizures ([place]) 4-5 years   had one time seizure, none since then per pt.    Past Surgical History:  Procedure Laterality Date  . LEFT HEART CATHETERIZATION WITH CORONARY ANGIOGRAM N/A 07/23/2013   Procedure: LEFT HEART CATHETERIZATION WITH CORONARY ANGIOGRAM;  Surgeon: Blane Ohara, MD;  Location: [facility] CATH LAB;  Service: Cardiovascular;  Laterality: N/A;  . LUMBAR DISC SURGERY     L5-S1    Current Medications: Current Meds  Medication Sig  . aspirin 81 MG tablet Take 1 tablet (81 mg total) by mouth daily.  . CVS D3 25 MCG (1000 UT) capsule TAKE 3 TABLETS DAILY. NEED OFFICE VISIT  . gabapentin (NEURONTIN) 100 MG capsule Take 200 mg by mouth at bedtime as needed.  . metoprolol succinate (TOPROL-XL) 25 MG 24 hr tablet Take 0.5 tablets (12.5 mg total) by mouth daily.  .  [DISCONTINUED] atorvastatin (LIPITOR) 10 MG tablet Take 1 tablet (10 mg total) by mouth daily at 6 PM.  . [DISCONTINUED] metoprolol succinate (TOPROL-XL) 25 MG 24 hr tablet Take 1/2 tablet (12.5 mg total) by mouth at bedtime.     Allergies:   Patient has no known allergies.   Social History   Socioeconomic History  . Marital status: Married    Spouse name: Not on file  . Number of children: 1  . Years of education: Not on file  . Highest education level: Not on file  Occupational History  . Occupation: restauranteur  Tobacco Use  . Smoking status: Never Smoker  . Smokeless tobacco: Never Used  Substance and Sexual Activity  . Alcohol use: No  . Drug use: No  . Sexual activity: Not on file  Other Topics Concern  . 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 Difficulty of Paying Living Expenses: Not on file  Food Insecurity:   . Worried About Charity fundraiser in the Last Year: Not on file  . Ran Out of Food in the Last Year: Not on file  Transportation Needs:   . Lack of Transportation (Medical): Not on file  . Lack of Transportation (Non-Medical): Not on file  Physical Activity:   .  Days of Exercise per Week: Not on file  . Minutes of Exercise per Session: Not on file  Stress:   . Feeling of Stress : Not on file  Social Connections:   . Frequency of Communication with Friends and Family: Not on file  . Frequency of Social Gatherings with Friends and Family: Not on file  . Attends Religious Services: Not on file  . Active Member of Clubs or Organizations: Not on file  . Attends Archivist Meetings: Not on file  . Marital Status: Not on file    Family History: The patient's family history includes Breast cancer in his sister; Diabetes in his brother; Kidney disease in his brother; Other (age of onset:  42) in his father; Other (age of onset: 60) in his mother. There is no history of Colon cancer, Prostate cancer, Coronary artery disease, or Heart attack.  ROS:   Please see the history of present illness.    All other systems reviewed and are negative.  EKGs/Labs/Other Studies Reviewed:    EKG:  EKG is ordered today.  The ekg ordered today demonstrates NSR, within normal limits  Recent Labs: 05/02/2019: ALT 26; BUN 18; Creatinine, Ser 0.72; Hemoglobin 14.6; Platelets 212.0; Potassium 4.3; Sodium 139  Recent Lipid Panel    Component Value Date/Time   CHOL 146 05/02/2019 1004   CHOL 131 07/29/2017 1014   TRIG 124.0 05/02/2019 1004   HDL 40.70 05/02/2019 1004   HDL 41 07/29/2017 1014   CHOLHDL 4 05/02/2019 1004   VLDL 24.8 05/02/2019 1004   LDLCALC 81 05/02/2019 1004   LDLCALC 74 07/29/2017 1014   LDLDIRECT 139.2 07/12/2013 1639    Physical Exam:    VS:  BP 134/82   Pulse 72   Ht 5\' 7"  (1.702 m)   Wt 189 lb 12.8 oz (86.1 kg)   SpO2 97%   BMI 29.73 kg/m     Wt Readings from Last 3 Encounters:  08/23/19 189 lb 12.8 oz (86.1 kg)  05/01/19 189 lb 12.8 oz (86.1 kg)  10/06/18 188 lb (85.3 kg)    GEN: Well nourished, well developed in no acute distress HEENT: Normal NECK: No JVD; No carotid bruits LYMPHATICS: No lymphadenopathy CARDIAC: RRR, no murmurs, rubs, gallops RESPIRATORY:  Clear to auscultation without rales, wheezing or rhonchi  ABDOMEN: Soft, non-tender, non-distended MUSCULOSKELETAL:  No edema; No deformity  SKIN: Warm and dry NEUROLOGIC:  Alert and oriented x 3 PSYCHIATRIC:  Normal affect   ASSESSMENT:    1. Mixed hyperlipidemia   2. Coronary artery disease involving native coronary artery of native heart without angina pectoris    PLAN:    In order of problems listed above:  1. Labs reviewed, LDL above goal of 70 mg/dL. Recommend change from atorvastatin which is limited by side effect, to rosuvastatin at low dose of 10 mg daily. Repeat lipids and  LFT's in 3 months. Lifestyle modification discussed.  2. Stable with no sx's of angina. Continue ASA, beta blocker, statin drub as above.   Medication Adjustments/Labs and Tests Ordered: Current medicines are reviewed at length with the patient today.  Concerns regarding medicines are outlined above.  Orders Placed This Encounter  Procedures  . LFTs  . Lipid panel  . EKG 12-Lead   Meds ordered this encounter  Medications  . rosuvastatin (CRESTOR) 10 MG tablet    Sig: Take 1 tablet (10 mg total) by mouth daily.    Dispense:  90 tablet    Refill:  3  .  metoprolol succinate (TOPROL-XL) 25 MG 24 hr tablet    Sig: Take 0.5 tablets (12.5 mg total) by mouth daily.    Dispense:  45 tablet    Refill:  3    Patient Instructions  Medication Instructions:  1) STOP ATORVASTATIN 2) START CRESTOR 10 mg daily *If you need a refill on your cardiac medications before your next appointment, please call your pharmacy*  Lab Work: Your provider recommends that you return for FASTING lab work in: 3 months  If you have labs (blood work) drawn today and your tests are completely normal, you will receive your results only by: Marland Kitchen MyChart Message (if you have MyChart) OR . A paper copy in the mail If you have any lab test that is abnormal or we need to change your treatment, we will call you to review the results.  Follow-Up: At [facility], you and your health needs are our priority.  As part of our continuing mission to provide you with exceptional heart care, we have created designated Provider Care Teams.  These Care Teams include your primary Cardiologist (physician) and Advanced Practice Providers (APPs -  Physician Assistants and Nurse Practitioners) who all work together to provide you with the care you need, when you need it.  Your next appointment:   12 month(s)  The format for your next appointment:   In Person  Provider:   You may see Dr. Burt Knack or one of the following Advanced  Practice Providers on your designated Care Team:    Richardson Dopp, PA-C  Vin Badger, PA-C  Daune Perch, [place]     Signed, Sherren Mocha, MD  08/24/2019 7:35 AM    [place]

## 2019-08-23 NOTE — Patient Instructions (Signed)
Medication Instructions:  1) STOP ATORVASTATIN 2) START CRESTOR 10 mg daily *If you need a refill on your cardiac medications before your next appointment, please call your pharmacy*  Lab Work: Your provider recommends that you return for FASTING lab work in: 3 months  If you have labs (blood work) drawn today and your tests are completely normal, you will receive your results only by: Marland Kitchen MyChart Message (if you have MyChart) OR . A paper copy in the mail If you have any lab test that is abnormal or we need to change your treatment, we will call you to review the results.  Follow-Up: At [facility], you and your health needs are our priority.  As part of our continuing mission to provide you with exceptional heart care, we have created designated Provider Care Teams.  These Care Teams include your primary Cardiologist (physician) and Advanced Practice Providers (APPs -  Physician Assistants and Nurse Practitioners) who all work together to provide you with the care you need, when you need it.  Your next appointment:   12 month(s)  The format for your next appointment:   In Person  Provider:   You may see Dr. Burt Knack or one of the following Advanced Practice Providers on your designated Care Team:    Richardson Dopp, PA-C  [place], [place]  Daune Perch, [place]

## 2019-08-24 ENCOUNTER — Encounter: Payer: Self-pay | Admitting: Cardiovascular Disease

## 2019-09-13 ENCOUNTER — Encounter

## 2019-11-21 ENCOUNTER — Other Ambulatory Visit: Payer: BC Managed Care – PPO | Admitting: *Deleted

## 2019-11-21 ENCOUNTER — Other Ambulatory Visit: Payer: Self-pay

## 2019-11-21 DIAGNOSIS — E782 Mixed hyperlipidemia: Secondary | ICD-10-CM | POA: Diagnosis not present

## 2019-11-21 LAB — HEPATIC FUNCTION PANEL
ALT: 22 IU/L (ref 0–44)
AST: 20 IU/L (ref 0–40)
Albumin: 4.7 g/dL (ref 3.8–4.8)
Alkaline Phosphatase: 58 IU/L (ref 39–117)
Bilirubin Total: 0.4 mg/dL (ref 0.0–1.2)
Bilirubin, Direct: 0.12 mg/dL (ref 0.00–0.40)
Total Protein: 7.2 g/dL (ref 6.0–8.5)

## 2019-11-21 LAB — LIPID PANEL
Chol/HDL Ratio: 2.9 ratio (ref 0.0–5.0)
Cholesterol, Total: 120 mg/dL (ref 100–199)
HDL: 42 mg/dL (ref >39)
LDL Chol Calc (NIH): 58 mg/dL (ref 0–99)
Triglycerides: 109 mg/dL (ref 0–149)
VLDL Cholesterol Cal: 20 mg/dL (ref 5–40)

## 2019-11-26 NOTE — Progress Notes (Signed)
Subjective:    Patient ID: Frank Matthews, male    DOB: [birth year], 64 y.o.   MRN: [id number]  HPI The patient is here for follow up of their chronic medical problems, including CAD, hypertension, hyperlipidemia, diabetes, vitamin d def, chronic lower back pain  Diabetes is new 04/2019.  He stopped eating sweets and cut back on rice.    He is taking all of his medications as prescribed.    He is exercising regularly.   Walking when able.  He is not going to the gym.     When he first wakes up he is very tired, but is ok after a couple of hours and fine the rest of the day.  He gets 6-8 hrs of sleep.  He thinks his sleep quality could be better-he does toss and turn a little.  Medications and allergies reviewed with patient and updated if appropriate.  Patient Active Problem List   Diagnosis Date Noted  . Mass of right side of neck 10/06/2018  . Vitamin D deficiency 05/26/2018  . Contusion of toenail 05/26/2018  . Spinal stenosis, lumbar region with neurogenic claudication 12/09/2016  . Chronic lower back pain 07/06/2016  . Muscle cramping 07/06/2016  . Diabetes mellitus without complication ([place]) 0000000  . CAD (coronary artery disease) 07/15/2013  . Other malaise and fatigue 12/04/2011  . Hyperlipidemia 12/02/2011  . Microscopic hematuria 11/09/2008    Current Outpatient Medications on File Prior to Visit  Medication Sig Dispense Refill  . aspirin 81 MG tablet Take 1 tablet (81 mg total) by mouth daily.    . CVS D3 25 MCG (1000 UT) capsule TAKE 3 TABLETS DAILY. NEED OFFICE VISIT 300 capsule 0  . gabapentin (NEURONTIN) 100 MG capsule Take 200 mg by mouth at bedtime as needed.    . metoprolol succinate (TOPROL-XL) 25 MG 24 hr tablet Take 0.5 tablets (12.5 mg total) by mouth daily. 45 tablet 3  . rosuvastatin (CRESTOR) 10 MG tablet Take 1 tablet (10 mg total) by mouth daily. 90 tablet 3   No current facility-administered medications on file prior to visit.    Past  Medical History:  Diagnosis Date  . H/O idiopathic seizure   . Mediterranean fever   . Seizures ([place]) 4-5 years   had one time seizure, none since then per pt.    Past Surgical History:  Procedure Laterality Date  . LEFT HEART CATHETERIZATION WITH CORONARY ANGIOGRAM N/A 07/23/2013   Procedure: LEFT HEART CATHETERIZATION WITH CORONARY ANGIOGRAM;  Surgeon: Blane Ohara, MD;  Location: [facility] CATH LAB;  Service: Cardiovascular;  Laterality: N/A;  . LUMBAR DISC SURGERY     L5-S1    Social History   Socioeconomic History  . Marital status: Married    Spouse name: Not on file  . Number of children: 1  . Years of education: Not on file  . Highest education level: Not on file  Occupational History  . Occupation: restauranteur  Tobacco Use  . Smoking status: Never Smoker  . Smokeless tobacco: Never Used  Substance and Sexual Activity  . Alcohol use: No  . Drug use: No  . Sexual activity: Not on file  Other Topics Concern  . 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 Difficulty of Paying Living Expenses:   Food  Insecurity:   . Worried About Charity fundraiser in the Last Year:   . Arboriculturist in the Last Year:   Transportation Needs:   . Film/video editor (Medical):   Marland Kitchen Lack of Transportation (Non-Medical):   Physical Activity:   . Days of Exercise per Week:   . Minutes of Exercise per Session:   Stress:   . Feeling of Stress :   Social Connections:   . Frequency of Communication with Friends and Family:   . Frequency of Social Gatherings with Friends and Family:   . Attends Religious Services:   . Active Member of Clubs or Organizations:   . Attends Archivist Meetings:   Marland Kitchen Marital Status:     Family History  Problem Relation Age of Onset  . Other Father 16       deceased, accidental  death  . Other Mother 44       natural causes  . Diabetes Brother   . Kidney disease Brother        ESRD - DM  . Breast cancer Sister   . Colon cancer Neg Hx   . Prostate cancer Neg Hx   . Coronary artery disease Neg Hx   . Heart attack Neg Hx     Review of Systems  Constitutional: Negative for chills and fever.  Respiratory: Negative for cough, shortness of breath and wheezing.   Cardiovascular: Negative for chest pain, palpitations and leg swelling.  Neurological: Negative for dizziness, light-headedness and headaches.       Objective:   Vitals:   11/27/19 1506  BP: (!) 150/78  Pulse: 90  Resp: 16  Temp: 98.6 F (37 C)  SpO2: 96%   BP Readings from Last 3 Encounters:  11/27/19 (!) 150/78  08/23/19 134/82  05/01/19 140/86   Wt Readings from Last 3 Encounters:  11/27/19 188 lb (85.3 kg)  08/23/19 189 lb 12.8 oz (86.1 kg)  05/01/19 189 lb 12.8 oz (86.1 kg)   Body mass index is 29.44 kg/m.   Physical Exam    Constitutional: Appears well-developed and well-nourished. No distress.  HENT:  Head: Normocephalic and atraumatic.  Neck: Neck supple. No tracheal deviation present. No thyromegaly present.  No cervical lymphadenopathy Cardiovascular: Normal rate, regular rhythm and normal heart sounds.   No murmur heard. No carotid bruit .  No edema Pulmonary/Chest: Effort normal and breath sounds normal. No respiratory distress. No has no wheezes. No rales.  Skin: Skin is warm and dry. Not diaphoretic.  Psychiatric: Normal mood and affect. Behavior is normal.      Assessment & Plan:   20 minutes were spent face-to-face with the patient discussing his new diagnosis of diabetes including etiology, lifestyle changes needed, nutrition, exercise, and medications.  We also discussed complications of uncontrolled diabetes, which she is familiar with because his brother had most of them..     See Problem List for Assessment and Plan of chronic medical problems.    This  visit occurred during the SARS-CoV-2 public health emergency.  Safety protocols were in place, including screening questions prior to the visit, additional usage of staff PPE, and extensive cleaning of exam room while observing appropriate contact time as indicated for disinfecting solutions.

## 2019-11-27 ENCOUNTER — Ambulatory Visit (INDEPENDENT_AMBULATORY_CARE_PROVIDER_SITE_OTHER): Payer: BC Managed Care – PPO | Admitting: Internal Medicine

## 2019-11-27 ENCOUNTER — Other Ambulatory Visit: Payer: Self-pay

## 2019-11-27 ENCOUNTER — Encounter: Payer: Self-pay | Admitting: Internal Medicine

## 2019-11-27 VITALS — BP 150/78 | HR 90 | Temp 98.6°F | Resp 16 | Ht 67.0 in | Wt 188.0 lb

## 2019-11-27 DIAGNOSIS — E119 Type 2 diabetes mellitus without complications: Secondary | ICD-10-CM | POA: Diagnosis not present

## 2019-11-27 DIAGNOSIS — E782 Mixed hyperlipidemia: Secondary | ICD-10-CM | POA: Diagnosis not present

## 2019-11-27 DIAGNOSIS — I2510 Atherosclerotic heart disease of native coronary artery without angina pectoris: Secondary | ICD-10-CM

## 2019-11-27 LAB — MICROALBUMIN / CREATININE URINE RATIO
Creatinine,U: 26.4 mg/dL
Microalb Creat Ratio: 5.9 mg/g (ref 0.0–30.0)
Microalb, Ur: 1.6 mg/dL (ref 0.0–1.9)

## 2019-11-27 LAB — BASIC METABOLIC PANEL
BUN: 17 mg/dL (ref 6–23)
CO2: 31 mEq/L (ref 19–32)
Calcium: 10.3 mg/dL (ref 8.4–10.5)
Chloride: 101 mEq/L (ref 96–112)
Creatinine, Ser: 0.72 mg/dL (ref 0.40–1.50)
GFR: 110.77 mL/min (ref >60.00)
Glucose, Bld: 115 mg/dL — ABNORMAL HIGH (ref 70–99)
Potassium: 4.6 mEq/L (ref 3.5–5.1)
Sodium: 139 mEq/L (ref 135–145)

## 2019-11-27 LAB — HEMOGLOBIN A1C: Hgb A1c MFr Bld: 6.3 % (ref 4.6–6.5)

## 2019-11-27 NOTE — Assessment & Plan Note (Signed)
Chronic Lipids controlled - checked by cardiology Continue daily statin Regular exercise and healthy diet encouraged

## 2019-11-27 NOTE — Assessment & Plan Note (Addendum)
chronic following with cardiology No angina like symptoms On ASA, statin, metoprolol bmp

## 2019-11-27 NOTE — Assessment & Plan Note (Signed)
Chronic New since 04/2019 Diet controlled Cut back on rice and sweets Exercising Check a1c

## 2019-11-27 NOTE — Patient Instructions (Signed)
  Blood work was ordered.   ° ° °Medications reviewed and updated.  Changes include :   none ° ° ° °Please followup in 6 months ° ° °

## 2020-02-26 DIAGNOSIS — Z20822 Contact with and (suspected) exposure to covid-19: Secondary | ICD-10-CM | POA: Diagnosis not present

## 2020-05-29 NOTE — Progress Notes (Signed)
Subjective:    Patient ID: Frank Matthews, male    DOB: 1955-10-15, 64 y.o.   MRN: [id number]  HPI The patient is here for follow up of their chronic medical problems, including CAD, htn, hyperlipidemia, DM, vitamin d def, chronic lower back pain  He is taking all of his medications as prescribed.     He is not exercising regularly.   He walks some.   He continues to have chronic lower back pain.   Medications and allergies reviewed with patient and updated if appropriate.  Patient Active Problem List   Diagnosis Date Noted  . Mass of right side of neck 10/06/2018  . Vitamin D deficiency 05/26/2018  . Spinal stenosis, lumbar region with neurogenic claudication 12/09/2016  . Chronic lower back pain 07/06/2016  . Muscle cramping 07/06/2016  . Diabetes mellitus without complication ([place]) 12/45/8099  . CAD (coronary artery disease) 07/15/2013  . Hyperlipidemia 12/02/2011    Current Outpatient Medications on File Prior to Visit  Medication Sig Dispense Refill  . aspirin 81 MG tablet Take 1 tablet (81 mg total) by mouth daily.    . CVS D3 25 MCG (1000 UT) capsule TAKE 3 TABLETS DAILY. NEED OFFICE VISIT 300 capsule 0  . metoprolol succinate (TOPROL-XL) 25 MG 24 hr tablet Take 0.5 tablets (12.5 mg total) by mouth daily. 45 tablet 3  . rosuvastatin (CRESTOR) 10 MG tablet Take 1 tablet (10 mg total) by mouth daily. 90 tablet 3  . gabapentin (NEURONTIN) 100 MG capsule Take 200 mg by mouth at bedtime as needed. (Patient not taking: Reported on 05/30/2020)     No current facility-administered medications on file prior to visit.    Past Medical History:  Diagnosis Date  . H/O idiopathic seizure   . Mediterranean fever   . Seizures ([place]) 4-5 years   had one time seizure, none since then per pt.    Past Surgical History:  Procedure Laterality Date  . LEFT HEART CATHETERIZATION WITH CORONARY ANGIOGRAM N/A 07/23/2013   Procedure: LEFT HEART CATHETERIZATION WITH CORONARY ANGIOGRAM;   Surgeon: Blane Ohara, MD;  Location: [facility] CATH LAB;  Service: Cardiovascular;  Laterality: N/A;  . LUMBAR DISC SURGERY     L5-S1    Social History   Socioeconomic History  . Marital status: Married    Spouse name: Not on file  . Number of children: 1  . Years of education: Not on file  . Highest education level: Not on file  Occupational History  . Occupation: restauranteur  Tobacco Use  . Smoking status: Never Smoker  . Smokeless tobacco: Never Used  Substance and Sexual Activity  . Alcohol use: No  . Drug use: No  . Sexual activity: Not on file  Other Topics Concern  . 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 Difficulty of Paying Living Expenses: Not on file  Food Insecurity:   . Worried About Charity fundraiser in the Last Year: Not on file  . Ran Out of Food in the Last Year: Not on file  Transportation Needs:   . Lack of Transportation (Medical): Not on file  . Lack of Transportation (Non-Medical): Not on file  Physical Activity:   . Days of Exercise per Week: Not on file  . Minutes of Exercise per  Session: Not on file  Stress:   . Feeling of Stress : Not on file  Social Connections:   . Frequency of Communication with Friends and Family: Not on file  . Frequency of Social Gatherings with Friends and Family: Not on file  . Attends Religious Services: Not on file  . Active Member of Clubs or Organizations: Not on file  . Attends Archivist Meetings: Not on file  . Marital Status: Not on file    Family History  Problem Relation Age of Onset  . Other Father 5       deceased, accidental death  . Other Mother 8       natural causes  . Diabetes Brother   . Kidney disease Brother        ESRD - DM  . Breast cancer Sister   . Colon cancer Neg Hx   . Prostate cancer Neg Hx   . Coronary  artery disease Neg Hx   . Heart attack Neg Hx     Review of Systems  Constitutional: Negative for chills and fever.  Respiratory: Negative for cough, shortness of breath and wheezing.   Cardiovascular: Negative for chest pain, palpitations and leg swelling.  Musculoskeletal: Positive for back pain.  Neurological: Negative for light-headedness and headaches.       Objective:   Vitals:   05/30/20 1419  BP: 120/82  Pulse: 74  Temp: 98.4 F (36.9 C)  SpO2: 95%   BP Readings from Last 3 Encounters:  05/30/20 120/82  11/27/19 (!) 150/78  08/23/19 134/82   Wt Readings from Last 3 Encounters:  05/30/20 187 lb (84.8 kg)  11/27/19 188 lb (85.3 kg)  08/23/19 189 lb 12.8 oz (86.1 kg)   Body mass index is 29.29 kg/m.   Physical Exam    Constitutional: Appears well-developed and well-nourished. No distress.  HENT:  Head: Normocephalic and atraumatic.  Neck: Neck supple. No tracheal deviation present. No thyromegaly present.  No cervical lymphadenopathy Cardiovascular: Normal rate, regular rhythm and normal heart sounds.   No murmur heard. No carotid bruit .  No edema Pulmonary/Chest: Effort normal and breath sounds normal. No respiratory distress. No has no wheezes. No rales.  Skin: Skin is warm and dry. Not diaphoretic.  Psychiatric: Normal mood and affect. Behavior is normal.      Assessment & Plan:    See Problem List for Assessment and Plan of chronic medical problems.    This visit occurred during the SARS-CoV-2 public health emergency.  Safety protocols were in place, including screening questions prior to the visit, additional usage of staff PPE, and extensive cleaning of exam room while observing appropriate contact time as indicated for disinfecting solutions.

## 2020-05-29 NOTE — Patient Instructions (Addendum)
  Blood work was ordered.     Medications reviewed and updated.  Changes include :   Take the gabapentin at night.  Your prescription(s) have been submitted to your pharmacy. Please take as directed and contact our office if you believe you are having problem(s) with the medication(s).  A referral was ordered for Neurosurgery.       Someone from their office will call you to schedule an appointment.    Please followup in 1 year

## 2020-05-30 ENCOUNTER — Ambulatory Visit: Payer: BC Managed Care – PPO | Admitting: Internal Medicine

## 2020-05-30 ENCOUNTER — Encounter: Payer: Self-pay | Admitting: Internal Medicine

## 2020-05-30 ENCOUNTER — Other Ambulatory Visit: Payer: Self-pay

## 2020-05-30 VITALS — BP 120/82 | HR 74 | Temp 98.4°F | Wt 187.0 lb

## 2020-05-30 DIAGNOSIS — E119 Type 2 diabetes mellitus without complications: Secondary | ICD-10-CM | POA: Diagnosis not present

## 2020-05-30 DIAGNOSIS — Z125 Encounter for screening for malignant neoplasm of prostate: Secondary | ICD-10-CM

## 2020-05-30 DIAGNOSIS — M48062 Spinal stenosis, lumbar region with neurogenic claudication: Secondary | ICD-10-CM

## 2020-05-30 DIAGNOSIS — E559 Vitamin D deficiency, unspecified: Secondary | ICD-10-CM

## 2020-05-30 DIAGNOSIS — I2510 Atherosclerotic heart disease of native coronary artery without angina pectoris: Secondary | ICD-10-CM

## 2020-05-30 DIAGNOSIS — E782 Mixed hyperlipidemia: Secondary | ICD-10-CM

## 2020-05-30 MED ORDER — GABAPENTIN 100 MG PO CAPS: 200.0000 mg | ORAL_CAPSULE | Freq: Every evening | ORAL | 3 refills | Status: DC | PRN

## 2020-05-30 NOTE — Assessment & Plan Note (Signed)
Taking vitamin d daily Check level 

## 2020-05-30 NOTE — Assessment & Plan Note (Signed)
Chronic Following with cardiology No concerning symptoms Continue current medications

## 2020-05-30 NOTE — Assessment & Plan Note (Signed)
Chronic Diet controlled Check a1c Low sugar / carb diet Stressed regular exercise

## 2020-05-30 NOTE — Assessment & Plan Note (Signed)
Chronic Check lipid panel  Continue daily statin Regular exercise and healthy diet encouraged  

## 2020-05-30 NOTE — Assessment & Plan Note (Addendum)
Chronic Having pain  - the gabapentin helped - he ran out and has pain and would like to restart the gabapentin Has lower back pain, sometimes radiating to left hip and leg Has some neurogenic claudication Restart gabapentin 200 mg HS Deferred MRI until after seeing the neurosurgeon

## 2020-06-02 LAB — LIPID PANEL
Cholesterol: 137 mg/dL (ref <200)
HDL: 47 mg/dL (ref >=40)
LDL Cholesterol (Calc): 73 mg/dL (calc)
Non-HDL Cholesterol (Calc): 90 mg/dL (calc) (ref <130)
Total CHOL/HDL Ratio: 2.9 (calc) (ref <5.0)
Triglycerides: 90 mg/dL (ref <150)

## 2020-06-02 LAB — CBC WITH DIFFERENTIAL/PLATELET
Absolute Monocytes: 571 cells/uL (ref 200–950)
Basophils Absolute: 42 cells/uL (ref 0–200)
Basophils Relative: 0.5 %
Eosinophils Absolute: 269 cells/uL (ref 15–500)
Eosinophils Relative: 3.2 %
HCT: 45.4 % (ref 38.5–50.0)
Hemoglobin: 14.9 g/dL (ref 13.2–17.1)
Lymphs Abs: 2562 cells/uL (ref 850–3900)
MCH: 28.2 pg (ref 27.0–33.0)
MCHC: 32.8 g/dL (ref 32.0–36.0)
MCV: 86 fL (ref 80.0–100.0)
MPV: 12.4 fL (ref 7.5–12.5)
Monocytes Relative: 6.8 %
Neutro Abs: 4956 cells/uL (ref 1500–7800)
Neutrophils Relative %: 59 %
Platelets: 230 10*3/uL (ref 140–400)
RBC: 5.28 10*6/uL (ref 4.20–5.80)
RDW: 12.8 % (ref 11.0–15.0)
Total Lymphocyte: 30.5 %
WBC: 8.4 10*3/uL (ref 3.8–10.8)

## 2020-06-02 LAB — COMPLETE METABOLIC PANEL WITH GFR
AG Ratio: 1.5 (calc) (ref 1.0–2.5)
ALT: 21 U/L (ref 9–46)
AST: 18 U/L (ref 10–35)
Albumin: 4.8 g/dL (ref 3.6–5.1)
Alkaline phosphatase (APISO): 51 U/L (ref 35–144)
BUN: 14 mg/dL (ref 7–25)
CO2: 27 mmol/L (ref 20–32)
Calcium: 10.8 mg/dL — ABNORMAL HIGH (ref 8.6–10.3)
Chloride: 103 mmol/L (ref 98–110)
Creat: 0.96 mg/dL (ref 0.70–1.25)
GFR, Est African American: 98 mL/min/{1.73_m2} (ref >=60)
GFR, Est Non African American: 84 mL/min/{1.73_m2} (ref >=60)
Globulin: 3.2 g/dL (calc) (ref 1.9–3.7)
Glucose, Bld: 101 mg/dL — ABNORMAL HIGH (ref 65–99)
Potassium: 5.1 mmol/L (ref 3.5–5.3)
Sodium: 143 mmol/L (ref 135–146)
Total Bilirubin: 0.4 mg/dL (ref 0.2–1.2)
Total Protein: 8 g/dL (ref 6.1–8.1)

## 2020-06-02 LAB — HEMOGLOBIN A1C
Hgb A1c MFr Bld: 6.4 % of total Hgb — ABNORMAL HIGH (ref <5.7)
Mean Plasma Glucose: 137 (calc)
eAG (mmol/L): 7.6 (calc)

## 2020-06-02 LAB — PSA, TOTAL AND FREE
PSA, % Free: 50 % (calc) (ref >25)
PSA, Free: 0.1 ng/mL
PSA, Total: 0.2 ng/mL (ref <=4.0)

## 2020-06-02 LAB — VITAMIN D 25 HYDROXY (VIT D DEFICIENCY, FRACTURES): Vit D, 25-Hydroxy: 54 ng/mL (ref 30–100)

## 2020-07-30 ENCOUNTER — Encounter: Payer: Self-pay | Admitting: Internal Medicine

## 2020-08-18 ENCOUNTER — Other Ambulatory Visit: Payer: Self-pay | Admitting: Cardiovascular Disease

## 2020-08-25 ENCOUNTER — Encounter: Payer: Self-pay | Admitting: Physician Assistant

## 2020-08-25 ENCOUNTER — Other Ambulatory Visit: Payer: Self-pay

## 2020-08-25 ENCOUNTER — Ambulatory Visit (INDEPENDENT_AMBULATORY_CARE_PROVIDER_SITE_OTHER): Payer: BC Managed Care – PPO | Admitting: Physician Assistant

## 2020-08-25 VITALS — BP 124/80 | HR 77 | Ht 67.0 in | Wt 186.0 lb

## 2020-08-25 DIAGNOSIS — I2510 Atherosclerotic heart disease of native coronary artery without angina pectoris: Secondary | ICD-10-CM

## 2020-08-25 DIAGNOSIS — E782 Mixed hyperlipidemia: Secondary | ICD-10-CM

## 2020-08-25 NOTE — Patient Instructions (Signed)
Medication Instructions:  Your physician recommends that you continue on your current medications as directed. Please refer to the Current Medication list given to you today. *If you need a refill on your cardiac medications before your next appointment, please call your pharmacy*   Lab Work: None If you have labs (blood work) drawn today and your tests are completely normal, you will receive your results only by:  [place] (if you have MyChart) OR  A paper copy in the mail If you have any lab test that is abnormal or we need to change your treatment, we will call you to review the results.   Testing/Procedures: None   Follow-Up: At [facility], you and your health needs are our priority.  As part of our continuing mission to provide you with exceptional heart care, we have created designated Provider Care Teams.  These Care Teams include your primary Cardiologist (physician) and Advanced Practice Providers (APPs -  Physician Assistants and Nurse Practitioners) who all work together to provide you with the care you need, when you need it.   Your next appointment:   1 year(s)  The format for your next appointment:   In Person  Provider:   Sherren Mocha, MD

## 2020-08-25 NOTE — Progress Notes (Signed)
Cardiology Office Note:    Date:  08/25/2020   ID:  Frank Matthews, DOB 1955/10/28, MRN [id number]  PCP:  Binnie Rail, MD  [facility] Cardiologist:  Sherren Mocha, MD  [place] Electrophysiologist:  None   Chief Complaint:   History of Present Illness:    Frank Matthews is a 64 y.o. male with a hx of with hx of CAD, pre-diabetic and HLD seen for follow up.   Hx of exertional angina in 2014. Cath showed 1. Severe stenosis of the first diagonal branch of the LAD with total occlusion of a subbranch of the diagonal. 2. Nonobstructive stenosis of the LAD, left circumflex, and right coronary arteries  Medical therapy recommended. He has done well since then.  Here today for follow up.  Patient was walking about 4 miles every single day before injury to his back.  He is slowly getting to his routine.  He denies chest pain, dizziness, orthopnea, PND, syncope, lower extremity edema or melena.  Has some shortness of breath with activity which is improving with gradual improvement in his daily routine.  His symptoms is not like prior angina.    Past Medical History:  Diagnosis Date  . H/O idiopathic seizure   . Mediterranean fever   . Seizures ([place]) 4-5 years   had one time seizure, none since then per pt.    Past Surgical History:  Procedure Laterality Date  . LEFT HEART CATHETERIZATION WITH CORONARY ANGIOGRAM N/A 07/23/2013   Procedure: LEFT HEART CATHETERIZATION WITH CORONARY ANGIOGRAM;  Surgeon: Blane Ohara, MD;  Location: [facility] CATH LAB;  Service: Cardiovascular;  Laterality: N/A;  . LUMBAR DISC SURGERY     L5-S1    Current Medications: Current Meds  Medication Sig  . aspirin 81 MG tablet Take 1 tablet (81 mg total) by mouth daily.  . CVS D3 25 MCG (1000 UT) capsule TAKE 3 TABLETS DAILY. NEED OFFICE VISIT  . gabapentin (NEURONTIN) 100 MG capsule Take 2 capsules (200 mg total) by mouth at bedtime as needed.  . metoprolol succinate (TOPROL-XL) 25 MG 24 hr  tablet Take 0.5 tablets (12.5 mg total) by mouth daily.  . rosuvastatin (CRESTOR) 10 MG tablet TAKE 1 TABLET BY MOUTH EVERY DAY     Allergies:   Patient has no known allergies.   Social History   Socioeconomic History  . Marital status: Married    Spouse name: Not on file  . Number of children: 1  . Years of education: Not on file  . Highest education level: Not on file  Occupational History  . Occupation: restauranteur  Tobacco Use  . Smoking status: Never Smoker  . Smokeless tobacco: Never Used  Substance and Sexual Activity  . Alcohol use: No  . Drug use: No  . Sexual activity: Not on file  Other Topics Concern  . Not on file  Social History Narrative   Sales promotion account executive   Married '91   1 daughter - '96   Work - Psychiatric nurse   Social Determinants of Radio broadcast assistant Strain: Not on [employer] Insecurity: Not on file  Transportation Needs: Not on file  Physical Activity: Not on file  Stress: Not on file  Social Connections: Not on file     Family History: The patient's family history includes Breast cancer in his sister; Diabetes in his brother; Kidney disease in his brother; Other (age of onset: 75) in his father; Other (age of onset: 73) in his  mother. There is no history of Colon cancer, Prostate cancer, Coronary artery disease, or Heart attack.    ROS:   Please see the history of present illness.    All other systems reviewed and are negative.   EKGs/Labs/Other Studies Reviewed:    The following studies were reviewed today:  Cath 07/2013 Coronary angiography: Coronary dominance: right  Left mainstem: The left mainstem arises from the left coronary cusp. The vessel is widely patent with no obstructive disease.  Left anterior descending (LAD): The LAD is normal in caliber. The vessel wraps around the left ventricular apex. The LAD has minor irregularity but no significant stenosis throughout its distribution to the apex.  However, the first diagonal has a 90% stenosis in its midportion. There is a subbranch of the diagonal is totally occluded and fills from left to left collaterals. The diagonal is small to medium in caliber.  Left circumflex (LCx): The left circumflex supplies a large intermediate branch with 20-the vessel is medium in caliber. There is 30% mid vessel stenosis. The distal vessel is patent. The PDA is patent. 30% ostial stenosis. The AV circumflex gives off 2 obtuse marginal branches without significant disease.  Right coronary artery (RCA): The RCA is a dominant vessel.  Left ventriculography: Left ventricular systolic function is normal, LVEF is estimated at 55-65%, there is no significant mitral regurgitation   Final Conclusions:   1. Severe stenosis of the first diagonal branch of the LAD with total occlusion of a subbranch of the diagonal.  2. Nonobstructive stenosis of the LAD, left circumflex, and right coronary arteries  3. Normal left ventricular function.  Recommendations: I would recommend medical therapy for this patient's coronary artery disease. His diagonal branch is small in caliber and it is best suited for medical treatment. Will start him on metoprolol 12.5 mg twice daily. Consider the addition of a long-acting nitrate depending on his response to the beta blocker.  EKG:  EKG is ordered today.  The ekg ordered today demonstrates   Normal sinus rhythm at rate of 77 bpm  Recent Labs: 05/30/2020: ALT 21; BUN 14; Creat 0.96; Hemoglobin 14.9; Platelets 230; Potassium 5.1; Sodium 143  Recent Lipid Panel    Component Value Date/Time   CHOL 137 05/30/2020 1508   CHOL 120 11/21/2019 0943   TRIG 90 05/30/2020 1508   HDL 47 05/30/2020 1508   HDL 42 11/21/2019 0943   CHOLHDL 2.9 05/30/2020 1508   VLDL 24.8 05/02/2019 1004   LDLCALC 73 05/30/2020 1508   LDLDIRECT 139.2 07/12/2013 1639    Physical Exam:    VS:  BP 124/80 (BP Location: Left Arm, Patient Position:  Sitting, Cuff Size: Normal)   Pulse 77   Ht 5\' 7"  (1.702 m)   Wt 186 lb (84.4 kg)   SpO2 95%   BMI 29.13 kg/m     Wt Readings from Last 3 Encounters:  08/25/20 186 lb (84.4 kg)  05/30/20 187 lb (84.8 kg)  11/27/19 188 lb (85.3 kg)     GEN: Well nourished, well developed in no acute distress HEENT: Normal NECK: No JVD; No carotid bruits LYMPHATICS: No lymphadenopathy CARDIAC: RRR, no murmurs, rubs, gallops RESPIRATORY:  Clear to auscultation without rales, wheezing or rhonchi  ABDOMEN: Soft, non-tender, non-distended MUSCULOSKELETAL:  No edema; No deformity  SKIN: Warm and dry NEUROLOGIC:  Alert and oriented x 3 PSYCHIATRIC:  Normal affect   ASSESSMENT AND PLAN:    1. CAD His shortness of breath sounds more deconditioning greater than angina.  Symptoms different than prior PCI.  His breathing is improving with more activity.  Continue current medical therapy with aspirin, statin and beta-blocker.  2.  Hyperlipidemia -05/30/2020: Cholesterol 137; HDL 47; LDL Cholesterol (Calc) 73; Triglycerides 90  -Continue statin -Followed by PCP  Medication Adjustments/Labs and Tests Ordered: Current medicines are reviewed at length with the patient today.  Concerns regarding medicines are outlined above.  Orders Placed This Encounter  Procedures  . EKG 12-Lead   No orders of the defined types were placed in this encounter.   Patient Instructions  Medication Instructions:  Your physician recommends that you continue on your current medications as directed. Please refer to the Current Medication list given to you today. *If you need a refill on your cardiac medications before your next appointment, please call your pharmacy*   Lab Work: None If you have labs (blood work) drawn today and your tests are completely normal, you will receive your results only by: Marland Kitchen MyChart Message (if you have MyChart) OR . A paper copy in the mail If you have any lab test that is abnormal or we  need to change your treatment, we will call you to review the results.   Testing/Procedures: None   Follow-Up: At [facility], you and your health needs are our priority.  As part of our continuing mission to provide you with exceptional heart care, we have created designated Provider Care Teams.  These Care Teams include your primary Cardiologist (physician) and Advanced Practice Providers (APPs -  Physician Assistants and Nurse Practitioners) who all work together to provide you with the care you need, when you need it.   Your next appointment:   1 year(s)  The format for your next appointment:   In Person  Provider:   Sherren Mocha, MD        Signed, Leanor Kail, [place]  08/25/2020 3:48 PM    [place]

## 2020-10-24 ENCOUNTER — Other Ambulatory Visit: Payer: Self-pay | Admitting: Cardiovascular Disease

## 2021-01-22 ENCOUNTER — Telehealth: Payer: Self-pay | Admitting: Internal Medicine

## 2021-01-22 NOTE — Telephone Encounter (Signed)
   Spouse calling, would like a recommendation for Ophthalmology in [facility] Patient needs cataract removed. Please send message back to patient via MyChart

## 2021-01-23 ENCOUNTER — Encounter: Payer: Self-pay | Admitting: Internal Medicine

## 2021-01-23 NOTE — Telephone Encounter (Signed)
Message sent

## 2021-04-22 ENCOUNTER — Other Ambulatory Visit: Payer: Self-pay | Admitting: Internal Medicine

## 2021-05-04 ENCOUNTER — Encounter: Payer: Self-pay | Admitting: Gastroenterology

## 2021-06-03 ENCOUNTER — Encounter: Payer: BC Managed Care – PPO | Admitting: Internal Medicine

## 2021-06-15 NOTE — Patient Instructions (Addendum)
Blood work was ordered.     Flu immunization administered today.     Medications changes include :   none   A referral was ordered for urology.        Someone from their office will call you to schedule an appointment.    Please followup in 1 year    Health Maintenance, Male Adopting a healthy lifestyle and getting preventive care are important in promoting health and wellness. Ask your health care provider about: The right schedule for you to have regular tests and exams. Things you can do on your own to prevent diseases and keep yourself healthy. What should I know about diet, weight, and exercise? Eat a healthy diet  Eat a diet that includes plenty of vegetables, fruits, low-fat dairy products, and lean protein. Do not eat a lot of foods that are high in solid fats, added sugars, or sodium. Maintain a healthy weight Body mass index (BMI) is a measurement that can be used to identify possible weight problems. It estimates body fat based on height and weight. Your health care provider can help determine your BMI and help you achieve or maintain a healthy weight. Get regular exercise Get regular exercise. This is one of the most important things you can do for your health. Most adults should: Exercise for at least 150 minutes each week. The exercise should increase your heart rate and make you sweat (moderate-intensity exercise). Do strengthening exercises at least twice a week. This is in addition to the moderate-intensity exercise. Spend less time sitting. Even light physical activity can be beneficial. Watch cholesterol and blood lipids Have your blood tested for lipids and cholesterol at 63 years of age, then have this test every 5 years. You may need to have your cholesterol levels checked more often if: Your lipid or cholesterol levels are high. You are older than 63 years of age. You are at high risk for heart disease. What should I know about cancer screening? Many  types of cancers can be detected early and may often be prevented. Depending on your health history and family history, you may need to have cancer screening at various ages. This may include screening for: Colorectal cancer. Prostate cancer. Skin cancer. Lung cancer. What should I know about heart disease, diabetes, and high blood pressure? Blood pressure and heart disease High blood pressure causes heart disease and increases the risk of stroke. This is more likely to develop in people who have high blood pressure readings, are of African descent, or are overweight. Talk with your health care provider about your target blood pressure readings. Have your blood pressure checked: Every 3-5 years if you are 40-53 years of age. Every year if you are 8 years old or older. If you are between the ages of 44 and 11 and are a current or former smoker, ask your health care provider if you should have a one-time screening for abdominal aortic aneurysm (AAA). Diabetes Have regular diabetes screenings. This checks your fasting blood sugar level. Have the screening done: Once every three years after age 18 if you are at a normal weight and have a low risk for diabetes. More often and at a younger age if you are overweight or have a high risk for diabetes. What should I know about preventing infection? Hepatitis B If you have a higher risk for hepatitis B, you should be screened for this virus. Talk with your health care provider to find out if you are at risk  for hepatitis B infection. Hepatitis C Blood testing is recommended for: Everyone born from [birth year] through 1963. Anyone with known risk factors for hepatitis C. Sexually transmitted infections (STIs) You should be screened each year for STIs, including gonorrhea and chlamydia, if: You are sexually active and are younger than 63 years of age. You are older than 63 years of age and your health care provider tells you that you are at risk for this  type of infection. Your sexual activity has changed since you were last screened, and you are at increased risk for chlamydia or gonorrhea. Ask your health care provider if you are at risk. Ask your health care provider about whether you are at high risk for HIV. Your health care provider may recommend a prescription medicine to help prevent HIV infection. If you choose to take medicine to prevent HIV, you should first get tested for HIV. You should then be tested every 3 months for as long as you are taking the medicine. Follow these instructions at home: Lifestyle Do not use any products that contain nicotine or tobacco, such as cigarettes, e-cigarettes, and chewing tobacco. If you need help quitting, ask your health care provider. Do not use street drugs. Do not share needles. Ask your health care provider for help if you need support or information about quitting drugs. Alcohol use Do not drink alcohol if your health care provider tells you not to drink. If you drink alcohol: Limit how much you have to 0-2 drinks a day. Be aware of how much alcohol is in your drink. In the [place] one drink equals one 12 oz bottle of beer (355 mL), one 5 oz glass of wine (148 mL), or one 1 oz glass of hard liquor (44 mL). General instructions Schedule regular health, dental, and eye exams. Stay current with your vaccines. Tell your health care provider if: You often feel depressed. You have ever been abused or do not feel safe at home. Summary Adopting a healthy lifestyle and getting preventive care are important in promoting health and wellness. Follow your health care provider's instructions about healthy diet, exercising, and getting tested or screened for diseases. Follow your health care provider's instructions on monitoring your cholesterol and blood pressure. This information is not intended to replace advice given to you by your health care provider. Make sure you discuss any questions you have  with your health care provider. Document Revised: 11/07/2020 Document Reviewed: 08/23/2018 Elsevier Patient Education  2022 [employer].

## 2021-06-15 NOTE — Progress Notes (Signed)
Subjective:    Patient ID: Frank Matthews, male    DOB: 07-19-1956, 65 y.o.   MRN: [id number]   This visit occurred during the SARS-CoV-2 public health emergency.  Safety protocols were in place, including screening questions prior to the visit, additional usage of staff PPE, and extensive cleaning of exam room while observing appropriate contact time as indicated for disinfecting solutions.   HPI He is here for a physical exam.   Back pain with radiation to hip and legs.  He is following with neurosurgery.  He had an EMG today.  He has a lack of feeling of a BM.  He is limited in what he can do - can not sit or stand or walk long periods.  He has also noted ED. he is not as active as he used to be because of the pain.   Medications and allergies reviewed with patient and updated if appropriate.  Patient Active Problem List   Diagnosis Date Noted   Erectile dysfunction 06/16/2021   Mass of right side of neck 10/06/2018   Vitamin D deficiency 05/26/2018   Spinal stenosis, lumbar region with neurogenic claudication - Dr Christella Noa 12/09/2016   Muscle cramping 07/06/2016   Diabetes mellitus without complication ([place]) 53/61/4431   CAD (coronary artery disease) - Dr Burt Knack 07/15/2013   Hyperlipidemia 12/02/2011    Current Outpatient Medications on File Prior to Visit  Medication Sig Dispense Refill   aspirin 81 MG tablet Take 1 tablet (81 mg total) by mouth daily.     CVS D3 25 MCG (1000 UT) capsule TAKE 3 TABLETS DAILY. NEED OFFICE VISIT 300 capsule 0   gabapentin (NEURONTIN) 100 MG capsule TAKE 2 CAPSULES (200 MG TOTAL) BY MOUTH AT BEDTIME AS NEEDED. 180 capsule 3   metoprolol succinate (TOPROL-XL) 25 MG 24 hr tablet TAKE 1/2 TABLET BY MOUTH EVERY DAY 45 tablet 3   rosuvastatin (CRESTOR) 10 MG tablet TAKE 1 TABLET BY MOUTH EVERY DAY 90 tablet 3   No current facility-administered medications on file prior to visit.    Past Medical History:  Diagnosis Date   H/O idiopathic  seizure    Mediterranean fever    Seizures ([place]) 4-5 years   had one time seizure, none since then per pt.    Past Surgical History:  Procedure Laterality Date   LEFT HEART CATHETERIZATION WITH CORONARY ANGIOGRAM N/A 07/23/2013   Procedure: LEFT HEART CATHETERIZATION WITH CORONARY ANGIOGRAM;  Surgeon: Blane Ohara, MD;  Location: [facility] CATH LAB;  Service: Cardiovascular;  Laterality: N/A;   LUMBAR DISC SURGERY     L5-S1    Social History   Socioeconomic History   Marital status: Married    Spouse name: Not on file   Number of children: 1   Years of education: Not on file   Highest education level: Not on file  Occupational History   Occupation: restauranteur  Tobacco Use   Smoking status: Never   Smokeless tobacco: Never  Substance and Sexual Activity   Alcohol use: No   Drug use: No   Sexual activity: Not on file  Other Topics Concern   Not on file  Social History Narrative   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, quantity   Married '91   1 daughter - '96   Work - Psychiatric nurse   Social Determinants of Radio broadcast assistant Strain: Not on file  Food Insecurity: Not on file  Transportation Needs: Not on file  Physical Activity: Not on file  Stress:  Not on file  Social Connections: Not on file    Family History  Problem Relation Age of Onset   Other Father 56       deceased, accidental death   Other Mother 42       natural causes   Diabetes Brother    Kidney disease Brother        ESRD - DM   Breast cancer Sister    Colon cancer Neg Hx    Prostate cancer Neg Hx    Coronary artery disease Neg Hx    Heart attack Neg Hx     Review of Systems  Constitutional:  Negative for fever.  Eyes:  Negative for visual disturbance.  Respiratory:  Positive for shortness of breath (maybe sometimes). Negative for cough and wheezing.   Cardiovascular:  Negative for chest pain, palpitations and leg swelling.  Gastrointestinal:  Negative for abdominal pain, blood in stool,  constipation, diarrhea and nausea.       Occ gerd  Genitourinary:  Negative for dysuria and hematuria.  Musculoskeletal:  Positive for arthralgias (knees, fingers) and back pain (radiation into hips, groins and legs).  Skin:  Negative for rash.  Neurological:  Positive for light-headedness (occ). Negative for dizziness, weakness and headaches.  Psychiatric/Behavioral:  Negative for dysphoric mood. The patient is not nervous/anxious.       Objective:   Vitals:   06/16/21 1347  BP: 122/80  Pulse: 77  Temp: 98.2 F (36.8 C)  SpO2: 96%   Filed Weights   06/16/21 1347  Weight: 189 lb (85.7 kg)   Body mass index is 29.6 kg/m.  BP Readings from Last 3 Encounters:  06/16/21 122/80  08/25/20 124/80  05/30/20 120/82    Wt Readings from Last 3 Encounters:  06/16/21 189 lb (85.7 kg)  08/25/20 186 lb (84.4 kg)  05/30/20 187 lb (84.8 kg)     Physical Exam Constitutional: He appears well-developed and well-nourished. No distress.  HENT:  Head: Normocephalic and atraumatic.  Right Ear: External ear normal.  Left Ear: External ear normal.  Mouth/Throat: Oropharynx is clear and moist.  Normal ear canals and TM b/l  Eyes: Conjunctivae and EOM are normal.  Neck: Neck supple. No tracheal deviation present. No thyromegaly present.  No carotid bruit  Cardiovascular: Normal rate, regular rhythm, normal heart sounds and intact distal pulses.   No murmur heard. Pulmonary/Chest: Effort normal and breath sounds normal. No respiratory distress. He has no wheezes. He has no rales.  Abdominal: Soft. He exhibits no distension. There is no tenderness.  Genitourinary: deferred  Musculoskeletal: He exhibits no edema.  Lymphadenopathy:   He has no cervical adenopathy.  Skin: Skin is warm and dry. He is not diaphoretic.  Psychiatric: He has a normal mood and affect. His behavior is normal.         Assessment & Plan:   Physical exam: Screening blood work  ordered Exercise   not as  regular due to increased back pain Weight he has gained some weight, which is due to his increasing back pain and he is aware of this. Substance abuse   none   Reviewed recommended immunizations.  Flu vaccine today.   Health Maintenance  Topic Date Due   FOOT EXAM  Never done   OPHTHALMOLOGY EXAM  Never done   URINE MICROALBUMIN  11/26/2020   HEMOGLOBIN A1C  11/27/2020   COLONOSCOPY (Pts 45-42yrs Insurance coverage will need to be confirmed)  01/20/2021   INFLUENZA VACCINE  04/13/2021  COVID-19 Vaccine (2 - Pfizer series) 07/02/2021 (Originally 02/06/2021)   Zoster Vaccines- Shingrix (1 of 2) 09/16/2021 (Originally 04/30/2008)   TETANUS/TDAP  12/14/2023   Hepatitis C Screening  Completed   HIV Screening  Completed   HPV VACCINES  Aged Out     See Problem List for Assessment and Plan of chronic medical problems.

## 2021-06-16 ENCOUNTER — Other Ambulatory Visit: Payer: Self-pay

## 2021-06-16 ENCOUNTER — Encounter: Payer: Self-pay | Admitting: Internal Medicine

## 2021-06-16 ENCOUNTER — Ambulatory Visit (INDEPENDENT_AMBULATORY_CARE_PROVIDER_SITE_OTHER): Payer: 59 | Admitting: Internal Medicine

## 2021-06-16 VITALS — BP 122/80 | HR 77 | Temp 98.2°F | Ht 67.0 in | Wt 189.0 lb

## 2021-06-16 DIAGNOSIS — E782 Mixed hyperlipidemia: Secondary | ICD-10-CM

## 2021-06-16 DIAGNOSIS — Z23 Encounter for immunization: Secondary | ICD-10-CM | POA: Diagnosis not present

## 2021-06-16 DIAGNOSIS — I2510 Atherosclerotic heart disease of native coronary artery without angina pectoris: Secondary | ICD-10-CM

## 2021-06-16 DIAGNOSIS — N521 Erectile dysfunction due to diseases classified elsewhere: Secondary | ICD-10-CM

## 2021-06-16 DIAGNOSIS — N529 Male erectile dysfunction, unspecified: Secondary | ICD-10-CM | POA: Insufficient documentation

## 2021-06-16 DIAGNOSIS — Z0000 Encounter for general adult medical examination without abnormal findings: Secondary | ICD-10-CM | POA: Diagnosis not present

## 2021-06-16 DIAGNOSIS — M48062 Spinal stenosis, lumbar region with neurogenic claudication: Secondary | ICD-10-CM

## 2021-06-16 DIAGNOSIS — Z125 Encounter for screening for malignant neoplasm of prostate: Secondary | ICD-10-CM

## 2021-06-16 DIAGNOSIS — E119 Type 2 diabetes mellitus without complications: Secondary | ICD-10-CM

## 2021-06-16 NOTE — Addendum Note (Signed)
Addended by: Marcina Millard on: 06/16/2021 04:55 PM   Modules accepted: Orders

## 2021-06-16 NOTE — Assessment & Plan Note (Signed)
Chronic Regular exercise and healthy diet encouraged Check lipid panel  Continue Crestor 10 mg daily 

## 2021-06-16 NOTE — Assessment & Plan Note (Signed)
Chronic Following with cardiology No symptoms suggestive of angina Continue aspirin 81 mg daily, metoprolol 12.5 mg daily and Crestor 10 mg daily CBC, CMP, TSH

## 2021-06-16 NOTE — Assessment & Plan Note (Addendum)
New ?  Related to lumbar spinal stenosis Referral urology

## 2021-06-16 NOTE — Assessment & Plan Note (Signed)
Chronic Diet controlled Check A1c, urine microalbumin He has gained a little weight and is not as active due to back pain, but hopefully her sugars are still well controlled

## 2021-06-16 NOTE — Assessment & Plan Note (Addendum)
Chronic Following with neurosurgery Back pain has worsened-lower back pain radiating to hips and bilateral anterior legs No numbness, tingling or weakness in legs He has had loss of having a bowel movement and erectile dysfunction Will refer to urology-?  Neurological Had EMG today and has follow-up with neurosurgery

## 2021-06-17 LAB — TSH: TSH: 1.93 u[IU]/mL (ref 0.35–5.50)

## 2021-06-17 LAB — CBC WITH DIFFERENTIAL/PLATELET
Basophils Absolute: 0 10*3/uL (ref 0.0–0.1)
Basophils Relative: 0.3 % (ref 0.0–3.0)
Eosinophils Absolute: 0.3 10*3/uL (ref 0.0–0.7)
Eosinophils Relative: 3 % (ref 0.0–5.0)
HCT: 42.6 % (ref 39.0–52.0)
Hemoglobin: 14.2 g/dL (ref 13.0–17.0)
Lymphocytes Relative: 21.3 % (ref 12.0–46.0)
Lymphs Abs: 1.8 10*3/uL (ref 0.7–4.0)
MCHC: 33.2 g/dL (ref 30.0–36.0)
MCV: 85.9 fl (ref 78.0–100.0)
Monocytes Absolute: 0.5 10*3/uL (ref 0.1–1.0)
Monocytes Relative: 6.3 % (ref 3.0–12.0)
Neutro Abs: 5.8 10*3/uL (ref 1.4–7.7)
Neutrophils Relative %: 69.1 % (ref 43.0–77.0)
Platelets: 227 10*3/uL (ref 150.0–400.0)
RBC: 4.96 Mil/uL (ref 4.22–5.81)
RDW: 13.5 % (ref 11.5–15.5)
WBC: 8.4 10*3/uL (ref 4.0–10.5)

## 2021-06-17 LAB — COMPREHENSIVE METABOLIC PANEL
ALT: 23 U/L (ref 0–53)
AST: 21 U/L (ref 0–37)
Albumin: 4.6 g/dL (ref 3.5–5.2)
Alkaline Phosphatase: 48 U/L (ref 39–117)
BUN: 13 mg/dL (ref 6–23)
CO2: 31 mEq/L (ref 19–32)
Calcium: 10 mg/dL (ref 8.4–10.5)
Chloride: 101 mEq/L (ref 96–112)
Creatinine, Ser: 0.81 mg/dL (ref 0.40–1.50)
GFR: 94.08 mL/min (ref >60.00)
Glucose, Bld: 139 mg/dL — ABNORMAL HIGH (ref 70–99)
Potassium: 4.3 mEq/L (ref 3.5–5.1)
Sodium: 139 mEq/L (ref 135–145)
Total Bilirubin: 0.5 mg/dL (ref 0.2–1.2)
Total Protein: 7.8 g/dL (ref 6.0–8.3)

## 2021-06-17 LAB — MICROALBUMIN / CREATININE URINE RATIO
Creatinine,U: 37 mg/dL
Microalb Creat Ratio: 1.9 mg/g (ref 0.0–30.0)
Microalb, Ur: <0.7 mg/dL (ref 0.0–1.9)

## 2021-06-17 LAB — LIPID PANEL
Cholesterol: 142 mg/dL (ref 0–200)
HDL: 46.9 mg/dL (ref >39.00)
LDL Cholesterol: 76 mg/dL (ref 0–99)
NonHDL: 95.53
Total CHOL/HDL Ratio: 3
Triglycerides: 96 mg/dL (ref 0.0–149.0)
VLDL: 19.2 mg/dL (ref 0.0–40.0)

## 2021-06-17 LAB — HEMOGLOBIN A1C: Hgb A1c MFr Bld: 7 % — ABNORMAL HIGH (ref 4.6–6.5)

## 2021-06-17 NOTE — Addendum Note (Signed)
Addended by: Boris Lown B on: 06/17/2021 10:54 AM   Modules accepted: Orders

## 2021-06-18 LAB — PSA, TOTAL AND FREE
PSA, % Free: 50 % (calc) (ref >25)
PSA, Free: 0.1 ng/mL
PSA, Total: 0.2 ng/mL (ref <=4.0)

## 2021-07-24 ENCOUNTER — Other Ambulatory Visit: Payer: Self-pay | Admitting: Cardiovascular Disease

## 2021-10-23 ENCOUNTER — Other Ambulatory Visit: Payer: Self-pay | Admitting: Cardiovascular Disease

## 2021-10-24 ENCOUNTER — Other Ambulatory Visit: Payer: Self-pay | Admitting: Cardiovascular Disease

## 2021-11-05 ENCOUNTER — Other Ambulatory Visit: Payer: Self-pay | Admitting: Cardiovascular Disease

## 2021-11-20 ENCOUNTER — Other Ambulatory Visit: Payer: Self-pay | Admitting: Cardiovascular Disease

## 2021-11-30 ENCOUNTER — Other Ambulatory Visit: Payer: Self-pay | Admitting: Cardiovascular Disease

## 2021-12-01 ENCOUNTER — Other Ambulatory Visit: Payer: Self-pay | Admitting: Cardiovascular Disease

## 2021-12-01 MED ORDER — ROSUVASTATIN CALCIUM 10 MG PO TABS: 10.0000 mg | ORAL_TABLET | Freq: Every day | ORAL | 0 refills | Status: DC

## 2021-12-01 MED ORDER — METOPROLOL SUCCINATE ER 25 MG PO TB24: 12.5000 mg | ORAL_TABLET | Freq: Every day | ORAL | 0 refills | Status: DC

## 2021-12-01 NOTE — Telephone Encounter (Signed)
Sent refills in to cover patient until his scheduled follow up visit with Dr. Burt Knack. ?

## 2021-12-25 ENCOUNTER — Other Ambulatory Visit: Payer: Self-pay | Admitting: Cardiovascular Disease

## 2021-12-29 ENCOUNTER — Other Ambulatory Visit: Payer: Self-pay | Admitting: Cardiovascular Disease

## 2022-03-02 ENCOUNTER — Ambulatory Visit: Payer: BC Managed Care – PPO | Admitting: Cardiovascular Disease

## 2022-03-02 ENCOUNTER — Encounter: Payer: Self-pay | Admitting: Cardiovascular Disease

## 2022-03-02 VITALS — BP 150/70 | HR 70 | Ht 66.0 in | Wt 185.2 lb

## 2022-03-02 DIAGNOSIS — I2510 Atherosclerotic heart disease of native coronary artery without angina pectoris: Secondary | ICD-10-CM | POA: Diagnosis not present

## 2022-03-02 DIAGNOSIS — E782 Mixed hyperlipidemia: Secondary | ICD-10-CM

## 2022-03-02 DIAGNOSIS — E119 Type 2 diabetes mellitus without complications: Secondary | ICD-10-CM

## 2022-03-02 MED ORDER — ROSUVASTATIN CALCIUM 10 MG PO TABS: 10.0000 mg | ORAL_TABLET | Freq: Every day | ORAL | 3 refills | Status: DC

## 2022-03-02 MED ORDER — METOPROLOL SUCCINATE ER 25 MG PO TB24: 12.5000 mg | ORAL_TABLET | Freq: Every day | ORAL | 3 refills | Status: DC

## 2022-03-02 NOTE — Patient Instructions (Signed)
Medication Instructions:  Your physician recommends that you continue on your current medications as directed. Please refer to the Current Medication list given to you today.  *If you need a refill on your cardiac medications before your next appointment, please call your pharmacy*   Lab Work: CBC, CMET, LIPIDS, A1C TODAY If you have labs (blood work) drawn today and your tests are completely normal, you will receive your results only by: [place] (if you have MyChart) OR A paper copy in the mail If you have any lab test that is abnormal or we need to change your treatment, we will call you to review the results.   Testing/Procedures: NONE   Follow-Up: At [facility], you and your health needs are our priority.  As part of our continuing mission to provide you with exceptional heart care, we have created designated Provider Care Teams.  These Care Teams include your primary Cardiologist (physician) and Advanced Practice Providers (APPs -  Physician Assistants and Nurse Practitioners) who all work together to provide you with the care you need, when you need it.  Your next appointment:   1 year(s)  The format for your next appointment:   In Person  Provider:   Sherren Mocha, MD    Important Information About Sugar

## 2022-03-02 NOTE — Progress Notes (Unsigned)
Cardiology Office Note:    Date:  03/03/2022   ID:  Frank Matthews, DOB Aug 11, 1956, MRN [id number]  PCP:  Binnie Rail, MD   [facility] Providers Cardiologist:  Sherren Mocha, MD     Referring MD: Binnie Rail, MD   Chief Complaint  Patient presents with   Coronary Artery Disease    History of Present Illness:    Frank Matthews is a 66 y.o. male presenting for follow-up of coronary artery disease.  In 2014 he presented with typical symptoms of exertional angina and underwent cardiac catheterization demonstrating severe stenosis of a small diagonal branch with no other high-grade obstructive disease in his major epicardial vessels.  Medical therapy was recommended and he was started on a beta-blocker at that time.  He has done quite well on medical therapy and has had no ischemic symptoms or events.  He currently denies chest pain, chest pressure, or shortness of breath.  He has some limitation related to arthritis and he was previously walking for 5 miles several days per week, now down to 1 mile because of orthopedic issues.  He checks his blood pressure at least a couple days per week and reports blood pressure ranging from 120 to 125 mmHg over 75 to 85 mmHg. He's enjoying travel with his wife and they have bought a beach villa in [place].   Past Medical History:  Diagnosis Date   H/O idiopathic seizure    Mediterranean fever    Seizures ([place]) 4-5 years   had one time seizure, none since then per pt.    Past Surgical History:  Procedure Laterality Date   LEFT HEART CATHETERIZATION WITH CORONARY ANGIOGRAM N/A 07/23/2013   Procedure: LEFT HEART CATHETERIZATION WITH CORONARY ANGIOGRAM;  Surgeon: Blane Ohara, MD;  Location: [facility] CATH LAB;  Service: Cardiovascular;  Laterality: N/A;   LUMBAR DISC SURGERY     L5-S1    Current Medications: Current Meds  Medication Sig   aspirin 81 MG tablet Take 1 tablet (81 mg total) by mouth daily.   [employer] D3 25 MCG (1000 UT) capsule  TAKE 3 TABLETS DAILY. NEED OFFICE VISIT   gabapentin (NEURONTIN) 100 MG capsule TAKE 2 CAPSULES (200 MG TOTAL) BY MOUTH AT BEDTIME AS NEEDED.   [DISCONTINUED] metoprolol succinate (TOPROL-XL) 25 MG 24 hr tablet Take 0.5 tablets (12.5 mg total) by mouth daily. Please keep your appointment with Dr. Burt Knack in June for more refills.   [DISCONTINUED] rosuvastatin (CRESTOR) 10 MG tablet Take 1 tablet (10 mg total) by mouth daily. Please keep your appointment with Dr. Burt Knack in June for more refills.     Allergies:   Patient has no known allergies.   Social History   Socioeconomic History   Marital status: Married    Spouse name: Not on file   Number of children: 1   Years of education: Not on file   Highest education level: Not on file  Occupational History   Occupation: restauranteur  Tobacco Use   Smoking status: Never   Smokeless tobacco: Never  Substance and Sexual Activity   Alcohol use: No   Drug use: No   Sexual activity: Not on file  Other Topics Concern   Not on file  [employer] Surveyor, quantity   Married '91   1 daughter - '96   Work - Psychiatric nurse   Social Determinants of Radio broadcast assistant Strain: Not on [employer] Insecurity: Not on file  Transportation Needs: Not on file  Physical Activity: Not on file  Stress: Not on file  Social Connections: Not on file     Family History: The patient's family history includes Breast cancer in his sister; Diabetes in his brother; Kidney disease in his brother; Other (age of onset: 3) in his father; Other (age of onset: 52) in his mother. There is no history of Colon cancer, Prostate cancer, Coronary artery disease, or Heart attack.  ROS:   Please see the history of present illness.    All other systems reviewed and are negative.  EKGs/Labs/Other Studies Reviewed:    EKG:  EKG is ordered today.  The ekg ordered today demonstrates normal sinus rhythm 70 bpm, within normal  limits.  Recent Labs: 06/17/2021: TSH 1.93 03/02/2022: ALT 21; BUN 14; Creatinine, Ser 0.81; Hemoglobin 14.6; Platelets 229; Potassium 4.4; Sodium 140  Recent Lipid Panel    Component Value Date/Time   CHOL 129 03/02/2022 1414   TRIG 97 03/02/2022 1414   HDL 46 03/02/2022 1414   CHOLHDL 2.8 03/02/2022 1414   CHOLHDL 3 06/17/2021 1055   VLDL 19.2 06/17/2021 1055   LDLCALC 65 03/02/2022 1414   LDLCALC 73 05/30/2020 1508   LDLDIRECT 139.2 07/12/2013 1639     Risk Assessment/Calculations:           Physical Exam:    VS:  BP (!) 150/70   Pulse 70   Ht '5\' 6"'$  (1.676 m)   Wt 185 lb 3.2 oz (84 kg)   SpO2 95%   BMI 29.89 kg/m     Wt Readings from Last 3 Encounters:  03/02/22 185 lb 3.2 oz (84 kg)  06/16/21 189 lb (85.7 kg)  08/25/20 186 lb (84.4 kg)     GEN:  Well nourished, well developed in no acute distress HEENT: Normal NECK: No JVD; No carotid bruits LYMPHATICS: No lymphadenopathy CARDIAC: RRR, no murmurs, rubs, gallops RESPIRATORY:  Clear to auscultation without rales, wheezing or rhonchi  ABDOMEN: Soft, non-tender, non-distended MUSCULOSKELETAL:  No edema; No deformity  SKIN: Warm and dry NEUROLOGIC:  Alert and oriented x 3 PSYCHIATRIC:  Normal affect   ASSESSMENT:    1. Mixed hyperlipidemia   2. Coronary artery disease involving native coronary artery of native heart without angina pectoris   3. Type 2 diabetes mellitus without complication, without long-term current use of insulin (HCC)    PLAN:    In order of problems listed above:  Treated with low-dose rosuvastatin.  He was switched a few years ago from atorvastatin to rosuvastatin because of side effects.  We will update lipids and LFTs today as he is fasting.  Lifestyle modification discussed at length and he will work on his diet and exercise program.  He has gotten rid of all grains from his diet. No angina on metoprolol succinate.  Continue current medication.  Continue aspirin 81 mg daily. We  will check a hemoglobin A1c today.  He thinks that this is all doing well with some dietary changes he made and avoiding starches in his diet.  Last hemoglobin A1c is 7.      Medication Adjustments/Labs and Tests Ordered: Current medicines are reviewed at length with the patient today.  Concerns regarding medicines are outlined above.  Orders Placed This Encounter  Procedures   CBC   Lipid panel   Hemoglobin A1c   Comprehensive metabolic panel   EKG 31-VQMG   Meds ordered this encounter  Medications   metoprolol succinate (TOPROL-XL) 25 MG 24  hr tablet    Sig: Take 0.5 tablets (12.5 mg total) by mouth daily.    Dispense:  45 tablet    Refill:  3   rosuvastatin (CRESTOR) 10 MG tablet    Sig: Take 1 tablet (10 mg total) by mouth daily.    Dispense:  90 tablet    Refill:  3    Patient Instructions  Medication Instructions:  Your physician recommends that you continue on your current medications as directed. Please refer to the Current Medication list given to you today.  *If you need a refill on your cardiac medications before your next appointment, please call your pharmacy*   Lab Work: CBC, CMET, LIPIDS, A1C TODAY If you have labs (blood work) drawn today and your tests are completely normal, you will receive your results only by: [place] (if you have MyChart) OR A paper copy in the mail If you have any lab test that is abnormal or we need to change your treatment, we will call you to review the results.   Testing/Procedures: NONE   Follow-Up: At [facility], you and your health needs are our priority.  As part of our continuing mission to provide you with exceptional heart care, we have created designated Provider Care Teams.  These Care Teams include your primary Cardiologist (physician) and Advanced Practice Providers (APPs -  Physician Assistants and Nurse Practitioners) who all work together to provide you with the care you need, when you need it.  Your  next appointment:   1 year(s)  The format for your next appointment:   In Person  Provider:   Sherren Mocha, MD    Important Information About Sugar         Signed, Sherren Mocha, MD  03/03/2022 4:43 PM    [place]

## 2022-03-03 LAB — CBC
Hematocrit: 43.8 % (ref 37.5–51.0)
Hemoglobin: 14.6 g/dL (ref 13.0–17.7)
MCH: 28.1 pg (ref 26.6–33.0)
MCHC: 33.3 g/dL (ref 31.5–35.7)
MCV: 84 fL (ref 79–97)
Platelets: 229 10*3/uL (ref 150–450)
RBC: 5.19 x10E6/uL (ref 4.14–5.80)
RDW: 12.9 % (ref 11.6–15.4)
WBC: 7 10*3/uL (ref 3.4–10.8)

## 2022-03-03 LAB — COMPREHENSIVE METABOLIC PANEL
ALT: 21 IU/L (ref 0–44)
AST: 17 IU/L (ref 0–40)
Albumin/Globulin Ratio: 1.9 (ref 1.2–2.2)
Albumin: 4.8 g/dL (ref 3.8–4.8)
Alkaline Phosphatase: 51 IU/L (ref 44–121)
BUN/Creatinine Ratio: 17 (ref 10–24)
BUN: 14 mg/dL (ref 8–27)
Bilirubin Total: 0.3 mg/dL (ref 0.0–1.2)
CO2: 24 mmol/L (ref 20–29)
Calcium: 9.8 mg/dL (ref 8.6–10.2)
Chloride: 99 mmol/L (ref 96–106)
Creatinine, Ser: 0.81 mg/dL (ref 0.76–1.27)
Globulin, Total: 2.5 g/dL (ref 1.5–4.5)
Glucose: 105 mg/dL — ABNORMAL HIGH (ref 70–99)
Potassium: 4.4 mmol/L (ref 3.5–5.2)
Sodium: 140 mmol/L (ref 134–144)
Total Protein: 7.3 g/dL (ref 6.0–8.5)
eGFR: 99 mL/min/{1.73_m2} (ref >59)

## 2022-03-03 LAB — LIPID PANEL
Chol/HDL Ratio: 2.8 ratio (ref 0.0–5.0)
Cholesterol, Total: 129 mg/dL (ref 100–199)
HDL: 46 mg/dL (ref >39)
LDL Chol Calc (NIH): 65 mg/dL (ref 0–99)
Triglycerides: 97 mg/dL (ref 0–149)
VLDL Cholesterol Cal: 18 mg/dL (ref 5–40)

## 2022-03-03 LAB — HEMOGLOBIN A1C
Est. average glucose Bld gHb Est-mCnc: 134 mg/dL
Hgb A1c MFr Bld: 6.3 % — ABNORMAL HIGH (ref 4.8–5.6)

## 2022-03-08 ENCOUNTER — Other Ambulatory Visit: Payer: Self-pay | Admitting: Internal Medicine

## 2022-04-05 DIAGNOSIS — E119 Type 2 diabetes mellitus without complications: Secondary | ICD-10-CM | POA: Diagnosis not present

## 2022-05-14 ENCOUNTER — Ambulatory Visit
Admission: RE | Admit: 2022-05-14 | Discharge: 2022-05-14 | Disposition: A | Discharge status: Home / Self Care | Payer: BC Managed Care – PPO | Source: Ambulatory Visit | Attending: Urgent Care | Admitting: Urgent Care

## 2022-05-14 VITALS — BP 153/88 | HR 86 | Temp 98.2°F | Resp 16

## 2022-05-14 DIAGNOSIS — B029 Zoster without complications: Secondary | ICD-10-CM | POA: Diagnosis not present

## 2022-05-14 DIAGNOSIS — W57XXXA Bitten or stung by nonvenomous insect and other nonvenomous arthropods, initial encounter: Secondary | ICD-10-CM | POA: Diagnosis not present

## 2022-05-14 DIAGNOSIS — R7303 Prediabetes: Secondary | ICD-10-CM | POA: Diagnosis not present

## 2022-05-14 MED ORDER — GABAPENTIN 300 MG PO CAPS: 300.0000 mg | ORAL_CAPSULE | Freq: Three times a day (TID) | ORAL | 0 refills | Status: DC

## 2022-05-14 MED ORDER — VALACYCLOVIR HCL 1 G PO TABS: 1000.0000 mg | ORAL_TABLET | Freq: Three times a day (TID) | ORAL | 0 refills | Status: DC

## 2022-05-14 NOTE — ED Provider Notes (Signed)
[facility] - URGENT CARE CENTER  Note:  This document was prepared using Systems analyst and may include unintentional dictation errors.  MRN: [id number] DOB: 1956-02-12  Subjective:   Frank Matthews is a 66 y.o. male presenting for 6-7 day history of acute onset painful rash over the left chest extending into the arm.  The week prior, patient felt significant pain over that side of the thoracic back.  He thought it might have been related to a tick bite he suffered about a month ago over the left lateral chest wall/back.  No fever, headache, sore throat, cough, shortness of breath, nausea, vomiting, abdominal pain.  No current facility-administered medications for this encounter.  Current Outpatient Medications:    aspirin 81 MG tablet, Take 1 tablet (81 mg total) by mouth daily., Disp: , Rfl:    gabapentin (NEURONTIN) 100 MG capsule, TAKE 2 CAPSULES (200 MG TOTAL) BY MOUTH AT BEDTIME AS NEEDED., Disp: 180 capsule, Rfl: 3   metoprolol succinate (TOPROL-XL) 25 MG 24 hr tablet, Take 0.5 tablets (12.5 mg total) by mouth daily., Disp: 45 tablet, Rfl: 3   rosuvastatin (CRESTOR) 10 MG tablet, Take 1 tablet (10 mg total) by mouth daily., Disp: 90 tablet, Rfl: 3   CVS D3 25 MCG (1000 UT) capsule, TAKE 3 TABLETS DAILY. NEED OFFICE VISIT, Disp: 300 capsule, Rfl: 0   No Known Allergies  Past Medical History:  Diagnosis Date   H/O idiopathic seizure    Mediterranean fever    Seizures ([place]) 4-5 years   had one time seizure, none since then per pt.     Past Surgical History:  Procedure Laterality Date   LEFT HEART CATHETERIZATION WITH CORONARY ANGIOGRAM N/A 07/23/2013   Procedure: LEFT HEART CATHETERIZATION WITH CORONARY ANGIOGRAM;  Surgeon: Blane Ohara, MD;  Location: [facility] CATH LAB;  Service: Cardiovascular;  Laterality: N/A;   LUMBAR DISC SURGERY     L5-S1    Family History  Problem Relation Age of Onset   Other Father 59       deceased, accidental death    Other Mother 68       natural causes   Diabetes Brother    Kidney disease Brother        ESRD - DM   Breast cancer Sister    Colon cancer Neg Hx    Prostate cancer Neg Hx    Coronary artery disease Neg Hx    Heart attack Neg Hx     Social History   Tobacco Use   Smoking status: Never   Smokeless tobacco: Never  Substance Use Topics   Alcohol use: No   Drug use: No    ROS   Objective:   Vitals: BP (!) 153/88 (BP Location: Left Arm)   Pulse 86   Temp 98.2 F (36.8 C) (Oral)   Resp 16   SpO2 93%   Physical Exam Constitutional:      General: He is not in acute distress.    Appearance: Normal appearance. He is well-developed and normal weight. He is not ill-appearing, toxic-appearing or diaphoretic.  HENT:     Head: Normocephalic and atraumatic.     Right Ear: External ear normal.     Left Ear: External ear normal.     Nose: Nose normal.     Mouth/Throat:     Pharynx: Oropharynx is clear.  Eyes:     General: No scleral icterus.       Right eye: No discharge.  Left eye: No discharge.     Extraocular Movements: Extraocular movements intact.  Cardiovascular:     Rate and Rhythm: Normal rate.  Pulmonary:     Effort: Pulmonary effort is normal.  Musculoskeletal:     Cervical back: Normal range of motion.       Back:  Skin:    Findings: Rash (2 distinct clusters of vesicular lesions on an erythematous base as depicted over the left upper medial arm, left chest in dermatomal pattern) present.  Neurological:     Mental Status: He is alert and oriented to person, place, and time.  Psychiatric:        Mood and Affect: Mood normal.        Behavior: Behavior normal.        Thought Content: Thought content normal.        Judgment: Judgment normal.          Assessment and Plan :   PDMP not reviewed this encounter.  1. Herpes zoster without complication   2. Tick bite, initial encounter   3. Prediabetes    We will manage for herpes zoster with  valacyclovir and increasing his gabapentin to 300 mg 3 times daily.  I do not suspect that this tick bite is related to his therapy zoster.  Low suspicion for tickborne illness. Counseled patient on potential for adverse effects with medications prescribed/recommended today, ER and return-to-clinic precautions discussed, patient verbalized understanding.    Jaynee Eagles, [place] 05/14/22 1546

## 2022-05-14 NOTE — ED Triage Notes (Signed)
Patient c/o rash on chest that started 6-7 days ago.   Patient endorses severe pain at sight which worsens at night.   Patient endorses itchiness.   Patient hasn't used any mediations for symptoms.

## 2022-05-17 ENCOUNTER — Encounter: Payer: Self-pay | Admitting: Internal Medicine

## 2022-05-17 NOTE — Progress Notes (Signed)
    Subjective:    Patient ID: Frank Matthews, male    DOB: 1955/09/24, 66 y.o.   MRN: [id number]      HPI Alric is here for  Chief Complaint  Patient presents with   Follow-up    Urgent Care follow up for Shingles    Tick bite one month ago.  10-11 days ago he started having a bad pain in the mid upper back.  After 2-3 days he saw the rash on his left arm and chest - it was very painful and almost numb.  He went to urgent care and was diagnosed with shingles.  Started on valtrex and gabapentin.  The gabapentin has not helped - so he stopped taking it.  He does feel like the pain has gotten a little better.  The pain is manageable.  He has had bad breath - not sure if that is related to the medication or the shingles.     Medications and allergies reviewed with patient and updated if appropriate.  Current Outpatient Medications on File Prior to Visit  Medication Sig Dispense Refill   aspirin 81 MG tablet Take 1 tablet (81 mg total) by mouth daily.     CVS D3 25 MCG (1000 UT) capsule TAKE 3 TABLETS DAILY. NEED OFFICE VISIT 300 capsule 0   gabapentin (NEURONTIN) 100 MG capsule TAKE 2 CAPSULES (200 MG TOTAL) BY MOUTH AT BEDTIME AS NEEDED. 180 capsule 3   gabapentin (NEURONTIN) 300 MG capsule Take 1 capsule (300 mg total) by mouth 3 (three) times daily. 30 capsule 0   metoprolol succinate (TOPROL-XL) 25 MG 24 hr tablet Take 0.5 tablets (12.5 mg total) by mouth daily. 45 tablet 3   rosuvastatin (CRESTOR) 10 MG tablet Take 1 tablet (10 mg total) by mouth daily. 90 tablet 3   valACYclovir (VALTREX) 1000 MG tablet Take 1 tablet (1,000 mg total) by mouth 3 (three) times daily. 30 tablet 0   No current facility-administered medications on file prior to visit.    Review of Systems  Constitutional:  Negative for fatigue and fever.  HENT:  Negative for sinus pressure and sinus pain.   Skin:  Positive for rash.       Objective:   Vitals:   05/18/22 1035  BP: 130/82  Pulse: 82   Temp: 98.1 F (36.7 C)  SpO2: 96%   BP Readings from Last 3 Encounters:  05/18/22 130/82  05/14/22 (!) 153/88  03/02/22 (!) 150/70   Wt Readings from Last 3 Encounters:  05/18/22 182 lb (82.6 kg)  03/02/22 185 lb 3.2 oz (84 kg)  06/16/21 189 lb (85.7 kg)   Body mass index is 29.38 kg/m.    Physical Exam Constitutional:      Appearance: Normal appearance. He is not ill-appearing.  HENT:     Head: Normocephalic and atraumatic.  Skin:    General: Skin is warm and dry.     Findings: Rash (Cluster of blisters on left upper chest and left anterior proximal arm-no open blisters-starting to scab over.  No generalized erythema) present. No erythema.  Neurological:     Mental Status: He is alert.            Assessment & Plan:    See Problem List for Assessment and Plan of chronic medical problems.

## 2022-05-18 ENCOUNTER — Ambulatory Visit: Payer: BC Managed Care – PPO | Admitting: Internal Medicine

## 2022-05-18 DIAGNOSIS — B029 Zoster without complications: Secondary | ICD-10-CM

## 2022-05-18 NOTE — Assessment & Plan Note (Signed)
Acute Left upper chest and left arm Diagnosed to urgent care 4 days ago and started on Valtrex and gabapentin Gabapentin has not helped the pain, the pain has improved and is tolerable Discussed that he can take over-the-counter medications such as Tylenol and ibuprofen Also discussed he can apply lidocaine gel or capsaicin cream to the area as long as there is no open wounds If pain is not tolerable or controlled advised him to let me know Complete Valtrex

## 2022-05-18 NOTE — Patient Instructions (Addendum)
You can try topical lidocaine or capsaicin cream for the shingles rash - do not use on any open sores.      Medications changes include :   none    Return for physical.   Shingles  Shingles, which is also known as herpes zoster, is an infection that causes a painful skin rash and fluid-filled blisters. It is caused by a virus. Shingles only develops in people who: Have had chickenpox. Have been vaccinated against chickenpox. Shingles is rare in this group. What are the causes? Shingles is caused by varicella-zoster virus. This is the same virus that causes chickenpox. After a person is exposed to the virus, it stays in the body in an inactive (dormant) state. Shingles develops if the virus is reactivated. This can happen many years after the first (initial) exposure to the virus. It is not known what causes this virus to be reactivated. What increases the risk? People who have had chickenpox or received the chickenpox vaccine are at risk for shingles. Shingles infection is more common in people who: Are older than 64 years of age. Have a weakened disease-fighting system (immune system), such as people with: HIV (human immunodeficiency virus). AIDS (acquired immunodeficiency syndrome). Cancer. Are taking medicines that weaken the immune system, such as organ transplant medicines. Are experiencing a lot of stress. What are the signs or symptoms? Early symptoms of this condition include itching, tingling, and pain in an area on your skin. Pain may be described as burning, stabbing, or throbbing. A few days or weeks after early symptoms start, a painful red rash appears. The rash is usually on one side of the body and has a band-like or belt-like pattern. The rash eventually turns into fluid-filled blisters that break open, change into scabs, and dry up in about 2-3 weeks. At any time during the infection, you may also develop: A fever. Chills. A headache. Nausea. How is this  diagnosed? This condition is diagnosed with a skin exam. Skin or fluid samples (a culture) may be taken from the blisters before a diagnosis is made. How is this treated? The rash may last for several weeks. There is not a specific cure for this condition. Your health care provider may prescribe medicines to help you manage pain, recover more quickly, and avoid long-term problems. Medicines may include: Antiviral medicines. Anti-inflammatory medicines. Pain medicines. Anti-itching medicines (antihistamines). If the area involved is on your face, you may be referred to a specialist, such as an eye doctor (ophthalmologist) or an ear, nose, and throat (ENT) doctor (otorhinolaryngologist) to help you avoid eye problems, chronic pain, or disability. Follow these instructions at home: Medicines Take over-the-counter and prescription medicines only as told by your health care provider. Apply an anti-itch cream or numbing cream to the affected area as told by your health care provider. Relieving itching and discomfort  Apply cold, wet cloths (cold compresses) to the area of the rash or blisters as told by your health care provider. Cool baths can be soothing. Try adding baking soda or dry oatmeal to the water to reduce itching. Do not bathe in hot water. Use calamine lotion as recommended by your health care provider. This is an over-the-counter lotion that helps to relieve itchiness. Blister and rash care Keep your rash covered with a loose bandage (dressing). Wear loose-fitting clothing to help ease the pain of material rubbing against the rash. Wash your hands with soap and water for at least 20 seconds before and after you change  your dressing. If soap and water are not available, use hand sanitizer. Change your dressing as told by your health care provider. Keep your rash and blisters clean by washing the area with mild soap and cool water as told by your health care provider. Check your rash  every day for signs of infection. Check for: More redness, swelling, or pain. Fluid or blood. Warmth. Pus or a bad smell. Do not scratch your rash or pick at your blisters. To help avoid scratching: Keep your fingernails clean and cut short. Wear gloves or mittens while you sleep, if scratching is a problem. General instructions Rest as told by your health care provider. Wash your hands often with soap and water for at least 20 seconds. If soap and water are not available, use hand sanitizer. Doing this lowers your chance of getting a bacterial skin infection. Before your blisters change into scabs, your shingles infection can cause chickenpox in people who have never had it or have never been vaccinated against it. To prevent this from happening, avoid contact with other people, especially: Babies. Pregnant women. Children who have eczema. Older people who have transplants. People who have chronic illnesses, such as cancer or AIDS. Keep all follow-up visits. This is important. How is this prevented? Getting vaccinated is the best way to prevent shingles and protect against shingles complications. If you have not been vaccinated, talk with your health care provider about getting the vaccine. Where to find more information Centers for Disease Control and Prevention: http://www.wolf.info/ Contact a health care provider if: Your pain is not relieved with prescribed medicines. Your pain does not get better after the rash heals. You have any of these signs of infection: More redness, swelling, or pain around the rash. Fluid or blood coming from the rash. Warmth coming from your rash. Pus or a bad smell coming from the rash. A fever. Get help right away if: The rash is on your face or nose. You have facial pain, pain around your eye area, or loss of feeling on one side of your face. You have difficulty seeing. You have ear pain or have ringing in your ear. You have a loss of taste. Your  condition gets worse. Summary Shingles, also known as herpes zoster, is an infection that causes a painful skin rash and fluid-filled blisters. This condition is diagnosed with a skin exam. Skin or fluid samples (a culture) may be taken from the blisters. Keep your rash covered with a loose bandage (dressing). Wear loose-fitting clothing to help ease the pain of material rubbing against the rash. Before your blisters change into scabs, your shingles infection can cause chickenpox in people who have never had it or have never been vaccinated against it. This information is not intended to replace advice given to you by your health care provider. Make sure you discuss any questions you have with your health care provider. Document Revised: 08/25/2020 Document Reviewed: 08/25/2020 Elsevier Patient Education  [place].

## 2022-06-21 ENCOUNTER — Encounter: Payer: Self-pay | Admitting: Internal Medicine

## 2022-06-21 NOTE — Progress Notes (Unsigned)
Subjective:    Patient ID: Frank Matthews, male    DOB: 1956-08-04, 66 y.o.   MRN: [id number]     HPI Frank Matthews is here for a physical exam.   Back is getting better.     BP at home in 120 SBP-always controlled at home and typically elevated here  Medications and allergies reviewed with patient and updated if appropriate.  Current Outpatient Medications on File Prior to Visit  Medication Sig Dispense Refill   aspirin 81 MG tablet Take 1 tablet (81 mg total) by mouth daily.     [facility] D3 25 MCG (1000 UT) capsule TAKE 3 TABLETS DAILY. NEED OFFICE VISIT 300 capsule 0   gabapentin (NEURONTIN) 100 MG capsule TAKE 2 CAPSULES (200 MG TOTAL) BY MOUTH AT BEDTIME AS NEEDED. 180 capsule 3   gabapentin (NEURONTIN) 300 MG capsule Take 1 capsule (300 mg total) by mouth 3 (three) times daily. 30 capsule 0   metoprolol succinate (TOPROL-XL) 25 MG 24 hr tablet Take 0.5 tablets (12.5 mg total) by mouth daily. 45 tablet 3   rosuvastatin (CRESTOR) 10 MG tablet Take 1 tablet (10 mg total) by mouth daily. 90 tablet 3   valACYclovir (VALTREX) 1000 MG tablet Take 1 tablet (1,000 mg total) by mouth 3 (three) times daily. 30 tablet 0   No current facility-administered medications on file prior to visit.    Review of Systems  Constitutional:  Negative for fever.  Eyes:  Negative for visual disturbance.  Respiratory:  Negative for cough, shortness of breath and wheezing.   Cardiovascular:  Negative for chest pain, palpitations and leg swelling.  Gastrointestinal:  Negative for abdominal pain, blood in stool, constipation, diarrhea and nausea.       No gerd  Genitourinary:  Negative for difficulty urinating, dysuria and hematuria.  Musculoskeletal:  Positive for arthralgias (mild - better) and back pain (improved).  Skin:  Negative for rash.  Neurological:  Negative for light-headedness and headaches.  Psychiatric/Behavioral:  Negative for dysphoric mood. The patient is not nervous/anxious.         Objective:   Vitals:   06/22/22 1539 06/22/22 1601  BP: (!) 144/80 132/78  Pulse: 84   Temp: 98.1 F (36.7 C)   SpO2: 97%    Filed Weights   06/22/22 1539  Weight: 181 lb (82.1 kg)   Body mass index is 29.21 kg/m.  BP Readings from Last 3 Encounters:  06/22/22 132/78  05/18/22 130/82  05/14/22 (!) 153/88    Wt Readings from Last 3 Encounters:  06/22/22 181 lb (82.1 kg)  05/18/22 182 lb (82.6 kg)  03/02/22 185 lb 3.2 oz (84 kg)      Physical Exam Constitutional: He appears well-developed and well-nourished. No distress.  HENT:  Head: Normocephalic and atraumatic.  Right Ear: External ear normal.  Left Ear: External ear normal.  Mouth/Throat: Oropharynx is clear and moist.  Normal ear canals and TM b/l  Eyes: Conjunctivae and EOM are normal.  Neck: Neck supple. No tracheal deviation present. No thyromegaly present.  No carotid bruit  Cardiovascular: Normal rate, regular rhythm, normal heart sounds and intact distal pulses.   No murmur heard. Pulmonary/Chest: Effort normal and breath sounds normal. No respiratory distress. He has no wheezes. He has no rales.  Abdominal: Soft. He exhibits no distension. There is no tenderness.  Genitourinary: deferred  Musculoskeletal: He exhibits no edema.  Lymphadenopathy:   He has no cervical adenopathy.  Skin: Skin is warm and dry. He is  not diaphoretic.  Psychiatric: He has a normal mood and affect. His behavior is normal.         Assessment & Plan:   Physical exam: Screening blood work  ordered Exercise   walking Weight stable-okay for age Substance abuse   none   Reviewed recommended immunizations.  Flu vaccine today   Health Maintenance  Topic Date Due   FOOT EXAM  Never done   OPHTHALMOLOGY EXAM  Never done   Diabetic kidney evaluation - Urine ACR  06/17/2022   COVID-19 Vaccine (2 - Pfizer series) 07/08/2022 (Originally 03/13/2021)   Zoster Vaccines- Shingrix (1 of 2) 09/22/2022 (Originally 04/30/2008)    HEMOGLOBIN A1C  09/01/2022   COLONOSCOPY (Pts 45-45yr Insurance coverage will need to be confirmed)  01/21/2023   Diabetic kidney evaluation - GFR measurement  03/03/2023   TETANUS/TDAP  12/14/2023   INFLUENZA VACCINE  Completed   Hepatitis C Screening  Completed   HIV Screening  Completed   HPV VACCINES  Aged Out     See Problem List for Assessment and Plan of chronic medical problems.

## 2022-06-21 NOTE — Patient Instructions (Addendum)
Flu immunization administered today.     Blood work was ordered.     Medications changes include : none      A referral was ordered for podiatry.     Someone from that office will call you to schedule an appointment.    Return in about 6 months (around 12/22/2022) for follow up.    Health Maintenance, Male Adopting a healthy lifestyle and getting preventive care are important in promoting health and wellness. Ask your health care provider about: The right schedule for you to have regular tests and exams. Things you can do on your own to prevent diseases and keep yourself healthy. What should I know about diet, weight, and exercise? Eat a healthy diet  Eat a diet that includes plenty of vegetables, fruits, low-fat dairy products, and lean protein. Do not eat a lot of foods that are high in solid fats, added sugars, or sodium. Maintain a healthy weight Body mass index (BMI) is a measurement that can be used to identify possible weight problems. It estimates body fat based on height and weight. Your health care provider can help determine your BMI and help you achieve or maintain a healthy weight. Get regular exercise Get regular exercise. This is one of the most important things you can do for your health. Most adults should: Exercise for at least 150 minutes each week. The exercise should increase your heart rate and make you sweat (moderate-intensity exercise). Do strengthening exercises at least twice a week. This is in addition to the moderate-intensity exercise. Spend less time sitting. Even light physical activity can be beneficial. Watch cholesterol and blood lipids Have your blood tested for lipids and cholesterol at 64 years of age, then have this test every 5 years. You may need to have your cholesterol levels checked more often if: Your lipid or cholesterol levels are high. You are older than 64 years of age. You are at high risk for heart disease. What should I  know about cancer screening? Many types of cancers can be detected early and may often be prevented. Depending on your health history and family history, you may need to have cancer screening at various ages. This may include screening for: Colorectal cancer. Prostate cancer. Skin cancer. Lung cancer. What should I know about heart disease, diabetes, and high blood pressure? Blood pressure and heart disease High blood pressure causes heart disease and increases the risk of stroke. This is more likely to develop in people who have high blood pressure readings or are overweight. Talk with your health care provider about your target blood pressure readings. Have your blood pressure checked: Every 3-5 years if you are 6-63 years of age. Every year if you are 42 years old or older. If you are between the ages of 62 and 74 and are a current or former smoker, ask your health care provider if you should have a one-time screening for abdominal aortic aneurysm (AAA). Diabetes Have regular diabetes screenings. This checks your fasting blood sugar level. Have the screening done: Once every three years after age 22 if you are at a normal weight and have a low risk for diabetes. More often and at a younger age if you are overweight or have a high risk for diabetes. What should I know about preventing infection? Hepatitis B If you have a higher risk for hepatitis B, you should be screened for this virus. Talk with your health care provider to find out if you are at  risk for hepatitis B infection. Hepatitis C Blood testing is recommended for: Everyone born from [birth year] through 1963. Anyone with known risk factors for hepatitis C. Sexually transmitted infections (STIs) You should be screened each year for STIs, including gonorrhea and chlamydia, if: You are sexually active and are younger than 64 years of age. You are older than 64 years of age and your health care provider tells you that you are at risk  for this type of infection. Your sexual activity has changed since you were last screened, and you are at increased risk for chlamydia or gonorrhea. Ask your health care provider if you are at risk. Ask your health care provider about whether you are at high risk for HIV. Your health care provider may recommend a prescription medicine to help prevent HIV infection. If you choose to take medicine to prevent HIV, you should first get tested for HIV. You should then be tested every 3 months for as long as you are taking the medicine. Follow these instructions at home: Alcohol use Do not drink alcohol if your health care provider tells you not to drink. If you drink alcohol: Limit how much you have to 0-2 drinks a day. Know how much alcohol is in your drink. In the [place] one drink equals one 12 oz bottle of beer (355 mL), one 5 oz glass of wine (148 mL), or one 1 oz glass of hard liquor (44 mL). Lifestyle Do not use any products that contain nicotine or tobacco. These products include cigarettes, chewing tobacco, and vaping devices, such as e-cigarettes. If you need help quitting, ask your health care provider. Do not use street drugs. Do not share needles. Ask your health care provider for help if you need support or information about quitting drugs. General instructions Schedule regular health, dental, and eye exams. Stay current with your vaccines. Tell your health care provider if: You often feel depressed. You have ever been abused or do not feel safe at home. Summary Adopting a healthy lifestyle and getting preventive care are important in promoting health and wellness. Follow your health care provider's instructions about healthy diet, exercising, and getting tested or screened for diseases. Follow your health care provider's instructions on monitoring your cholesterol and blood pressure. This information is not intended to replace advice given to you by your health care provider. Make  sure you discuss any questions you have with your health care provider. Document Revised: 01/19/2021 Document Reviewed: 01/19/2021 Elsevier Patient Education  [place].

## 2022-06-22 ENCOUNTER — Ambulatory Visit (INDEPENDENT_AMBULATORY_CARE_PROVIDER_SITE_OTHER): Payer: BC Managed Care – PPO | Admitting: Internal Medicine

## 2022-06-22 VITALS — BP 132/78 | HR 84 | Temp 98.1°F | Ht 66.0 in | Wt 181.0 lb

## 2022-06-22 DIAGNOSIS — Z0000 Encounter for general adult medical examination without abnormal findings: Secondary | ICD-10-CM

## 2022-06-22 DIAGNOSIS — M48062 Spinal stenosis, lumbar region with neurogenic claudication: Secondary | ICD-10-CM

## 2022-06-22 DIAGNOSIS — E782 Mixed hyperlipidemia: Secondary | ICD-10-CM | POA: Diagnosis not present

## 2022-06-22 DIAGNOSIS — I2510 Atherosclerotic heart disease of native coronary artery without angina pectoris: Secondary | ICD-10-CM

## 2022-06-22 DIAGNOSIS — E559 Vitamin D deficiency, unspecified: Secondary | ICD-10-CM

## 2022-06-22 DIAGNOSIS — Z23 Encounter for immunization: Secondary | ICD-10-CM

## 2022-06-22 DIAGNOSIS — Z125 Encounter for screening for malignant neoplasm of prostate: Secondary | ICD-10-CM | POA: Diagnosis not present

## 2022-06-22 DIAGNOSIS — E119 Type 2 diabetes mellitus without complications: Secondary | ICD-10-CM | POA: Diagnosis not present

## 2022-06-22 DIAGNOSIS — H409 Unspecified glaucoma: Secondary | ICD-10-CM | POA: Insufficient documentation

## 2022-06-22 NOTE — Assessment & Plan Note (Addendum)
Chronic  Lab Results  Component Value Date   HGBA1C 6.3 (H) 03/02/2022   Sugars well controlled Check A1c, urine microalbumin today Continue lifestyle control Not eating many grains Stressed regular exercise, diabetic diet

## 2022-06-22 NOTE — Assessment & Plan Note (Signed)
Chronic Following with neurosurgery Back pain improved some He is exercising regularly Taking gabapentin

## 2022-06-22 NOTE — Assessment & Plan Note (Signed)
Chronic No symptoms consistent with angina Following with cardiology Continue aspirin 81 mg daily, rosuvastatin 10 mg daily Blood pressure well controlled at home-okay here

## 2022-06-22 NOTE — Assessment & Plan Note (Signed)
Chronic Taking vitamin D daily Check vitamin D level  

## 2022-06-22 NOTE — Assessment & Plan Note (Signed)
Chronic Regular exercise and healthy diet encouraged Check lipid panel  Continue Crestor 10 mg daily 

## 2022-06-23 LAB — CBC WITH DIFFERENTIAL/PLATELET
Basophils Absolute: 0 10*3/uL (ref 0.0–0.1)
Basophils Relative: 0.4 % (ref 0.0–3.0)
Eosinophils Absolute: 0.2 10*3/uL (ref 0.0–0.7)
Eosinophils Relative: 3.2 % (ref 0.0–5.0)
HCT: 42.2 % (ref 39.0–52.0)
Hemoglobin: 13.9 g/dL (ref 13.0–17.0)
Lymphocytes Relative: 21.1 % (ref 12.0–46.0)
Lymphs Abs: 1.6 10*3/uL (ref 0.7–4.0)
MCHC: 32.9 g/dL (ref 30.0–36.0)
MCV: 85.3 fl (ref 78.0–100.0)
Monocytes Absolute: 0.5 10*3/uL (ref 0.1–1.0)
Monocytes Relative: 6.1 % (ref 3.0–12.0)
Neutro Abs: 5.2 10*3/uL (ref 1.4–7.7)
Neutrophils Relative %: 69.2 % (ref 43.0–77.0)
Platelets: 208 10*3/uL (ref 150.0–400.0)
RBC: 4.95 Mil/uL (ref 4.22–5.81)
RDW: 14.3 % (ref 11.5–15.5)
WBC: 7.5 10*3/uL (ref 4.0–10.5)

## 2022-06-23 LAB — COMPREHENSIVE METABOLIC PANEL
ALT: 21 U/L (ref 0–53)
AST: 19 U/L (ref 0–37)
Albumin: 4.5 g/dL (ref 3.5–5.2)
Alkaline Phosphatase: 46 U/L (ref 39–117)
BUN: 11 mg/dL (ref 6–23)
CO2: 31 mEq/L (ref 19–32)
Calcium: 10 mg/dL (ref 8.4–10.5)
Chloride: 103 mEq/L (ref 96–112)
Creatinine, Ser: 0.76 mg/dL (ref 0.40–1.50)
GFR: 95.23 mL/min (ref >60.00)
Glucose, Bld: 117 mg/dL — ABNORMAL HIGH (ref 70–99)
Potassium: 4.7 mEq/L (ref 3.5–5.1)
Sodium: 141 mEq/L (ref 135–145)
Total Bilirubin: 0.5 mg/dL (ref 0.2–1.2)
Total Protein: 7.6 g/dL (ref 6.0–8.3)

## 2022-06-23 LAB — LIPID PANEL
Cholesterol: 134 mg/dL (ref 0–200)
HDL: 47.4 mg/dL (ref >39.00)
LDL Cholesterol: 64 mg/dL (ref 0–99)
NonHDL: 86.75
Total CHOL/HDL Ratio: 3
Triglycerides: 114 mg/dL (ref 0.0–149.0)
VLDL: 22.8 mg/dL (ref 0.0–40.0)

## 2022-06-23 LAB — MICROALBUMIN / CREATININE URINE RATIO
Creatinine,U: 32.3 mg/dL
Microalb Creat Ratio: 2.2 mg/g (ref 0.0–30.0)
Microalb, Ur: <0.7 mg/dL (ref 0.0–1.9)

## 2022-06-23 LAB — HEMOGLOBIN A1C: Hgb A1c MFr Bld: 6.6 % — ABNORMAL HIGH (ref 4.6–6.5)

## 2022-06-23 LAB — PSA: PSA: 0.14 ng/mL (ref 0.10–4.00)

## 2022-08-17 ENCOUNTER — Encounter: Payer: Self-pay | Admitting: Podiatry

## 2022-08-17 ENCOUNTER — Ambulatory Visit: Payer: BC Managed Care – PPO | Admitting: Podiatry

## 2022-08-17 ENCOUNTER — Encounter: Payer: BC Managed Care – PPO | Admitting: Internal Medicine

## 2022-08-17 DIAGNOSIS — M25571 Pain in right ankle and joints of right foot: Secondary | ICD-10-CM | POA: Insufficient documentation

## 2022-08-17 DIAGNOSIS — R202 Paresthesia of skin: Secondary | ICD-10-CM | POA: Insufficient documentation

## 2022-08-17 DIAGNOSIS — M2141 Flat foot [pes planus] (acquired), right foot: Secondary | ICD-10-CM

## 2022-08-17 DIAGNOSIS — M2142 Flat foot [pes planus] (acquired), left foot: Secondary | ICD-10-CM | POA: Diagnosis not present

## 2022-08-17 DIAGNOSIS — E119 Type 2 diabetes mellitus without complications: Secondary | ICD-10-CM

## 2022-08-17 DIAGNOSIS — M79606 Pain in leg, unspecified: Secondary | ICD-10-CM | POA: Insufficient documentation

## 2022-08-17 NOTE — Patient Instructions (Signed)
Shoes I like with a wide toe box: Hoka, Altra   Inserts I like: Powerstep, Protalus, Superfeet

## 2022-08-20 NOTE — Progress Notes (Signed)
  Subjective:  Patient ID: Frank Matthews, male    DOB: 1956/02/22,  MRN: [id number]  Chief Complaint  Patient presents with   Diabetes    Diabetic foot exam   Flat Foot    NP bilateral flat feet, he is having some back pain and thinks that arch support or orthotics would help    66 y.o. male presents with the above complaint. History confirmed with patient.   Objective:  Physical Exam: warm, good capillary refill, no trophic changes or ulcerative lesions, normal DP and PT pulses, normal monofilament exam, normal sensory exam, and mild pes planovalgus deformity  Assessment:   1. Encounter for diabetic foot exam ([place])   2. Pes planus of both feet      Plan:  Patient was evaluated and treated and all questions answered.  Patient educated on diabetes. Discussed proper diabetic foot care and discussed risks and complications of disease. Educated patient in depth on reasons to return to the office immediately should he/she discover anything concerning or new on the feet. All questions answered. Discussed proper shoes as well.   Discussed the etiology, pathomechanics and treatment options in detail with the patient, we also reviewed today's radiographs in detail.  We discussed how pes planus deformity without pain or functional limitation is quite common and often does not require any treatment.  However when pain or functional limitation arises, treatment with nonsurgical therapy is our first line with stretching, physical therapy, and supportive orthoses.  Also discussed that when these treatments fail patients often do well with surgical treatment of these deformities.  We also discussed the impact of the deformity on soft tissue and the joints and development of arthritis over time.  Today I recommended that he begin with a prefabricated foot orthosis we also discussed certain shoe gear that are supportive.  He will begin this and let me know if he is in need of custom molded orthoses prior  to this.   Return in about 1 year (around 08/18/2023) for diabetic foot exam, return sooner if needing custom orthotics .

## 2022-11-17 ENCOUNTER — Telehealth: Payer: Self-pay | Admitting: Internal Medicine

## 2022-11-17 ENCOUNTER — Other Ambulatory Visit: Payer: Self-pay

## 2022-11-17 ENCOUNTER — Other Ambulatory Visit: Payer: Self-pay | Admitting: Cardiovascular Disease

## 2022-11-17 DIAGNOSIS — E782 Mixed hyperlipidemia: Secondary | ICD-10-CM

## 2022-11-17 DIAGNOSIS — E119 Type 2 diabetes mellitus without complications: Secondary | ICD-10-CM

## 2022-11-17 DIAGNOSIS — I2510 Atherosclerotic heart disease of native coronary artery without angina pectoris: Secondary | ICD-10-CM

## 2022-11-17 MED ORDER — GABAPENTIN 100 MG PO CAPS: 200.0000 mg | ORAL_CAPSULE | Freq: Every evening | ORAL | 3 refills | Status: DC | PRN

## 2022-11-17 MED ORDER — ROSUVASTATIN CALCIUM 10 MG PO TABS: 10.0000 mg | ORAL_TABLET | Freq: Every day | ORAL | 0 refills | Status: DC

## 2022-11-17 NOTE — Telephone Encounter (Signed)
Script sent in today.

## 2022-11-17 NOTE — Telephone Encounter (Signed)
Caller & Relationship to patient: Self  Call back number: [phone]   Date of last office visit: 10.10.23  Date of next office visit: 5.6.24  Medication(s) to be refilled:  gabapentin (NEURONTIN) 100 MG capsule   [facility]   [id number]  Phone: [phone] Fax: [phone]

## 2022-11-17 NOTE — Telephone Encounter (Signed)
Pt's medication was sent to pt's pharmacy as requested. Confirmation received.  °

## 2022-11-19 NOTE — Telephone Encounter (Signed)
Crestor reordered per patient request.

## 2022-11-19 NOTE — Telephone Encounter (Signed)
Crestor ordered per patient request 11/17/22.

## 2022-12-21 ENCOUNTER — Other Ambulatory Visit: Payer: Self-pay | Admitting: Cardiovascular Disease

## 2022-12-21 DIAGNOSIS — E782 Mixed hyperlipidemia: Secondary | ICD-10-CM

## 2022-12-21 DIAGNOSIS — E119 Type 2 diabetes mellitus without complications: Secondary | ICD-10-CM

## 2022-12-21 DIAGNOSIS — I2510 Atherosclerotic heart disease of native coronary artery without angina pectoris: Secondary | ICD-10-CM

## 2022-12-22 ENCOUNTER — Other Ambulatory Visit: Payer: Self-pay | Admitting: Cardiovascular Disease

## 2022-12-22 DIAGNOSIS — I2510 Atherosclerotic heart disease of native coronary artery without angina pectoris: Secondary | ICD-10-CM

## 2022-12-22 DIAGNOSIS — E782 Mixed hyperlipidemia: Secondary | ICD-10-CM

## 2022-12-22 DIAGNOSIS — E119 Type 2 diabetes mellitus without complications: Secondary | ICD-10-CM

## 2022-12-22 MED ORDER — ROSUVASTATIN CALCIUM 10 MG PO TABS
10.0000 mg | ORAL_TABLET | Freq: Every day | ORAL | 0 refills | Status: DC
Start: 2022-12-22 — End: 2022-12-23

## 2022-12-23 ENCOUNTER — Other Ambulatory Visit: Payer: Self-pay | Admitting: Cardiovascular Disease

## 2022-12-23 DIAGNOSIS — I2510 Atherosclerotic heart disease of native coronary artery without angina pectoris: Secondary | ICD-10-CM

## 2022-12-23 DIAGNOSIS — E119 Type 2 diabetes mellitus without complications: Secondary | ICD-10-CM

## 2022-12-23 DIAGNOSIS — E782 Mixed hyperlipidemia: Secondary | ICD-10-CM

## 2022-12-24 ENCOUNTER — Telehealth: Payer: Self-pay

## 2022-12-24 ENCOUNTER — Other Ambulatory Visit (HOSPITAL_COMMUNITY): Payer: Self-pay

## 2022-12-24 MED ORDER — ROSUVASTATIN CALCIUM 10 MG PO TABS
10.0000 mg | ORAL_TABLET | Freq: Every day | ORAL | 0 refills | Status: DC
Start: 2022-12-24 — End: 2023-08-10

## 2022-12-27 NOTE — Telephone Encounter (Signed)
Pharmacy Patient Advocate Encounter  Prior Authorization for Crestor 10mg  has been approved by [employer]).    key# [id number]  Effective dates: 4.14.24 through 4.14.25

## 2023-01-12 MED ORDER — ROSUVASTATIN CALCIUM 10 MG PO TABS
10.0000 mg | ORAL_TABLET | Freq: Every day | ORAL | 0 refills | Status: DC
Start: 2023-01-12 — End: 2023-08-02

## 2023-01-17 ENCOUNTER — Ambulatory Visit: Payer: BC Managed Care – PPO | Admitting: Internal Medicine

## 2023-01-19 ENCOUNTER — Ambulatory Visit: Payer: 59 | Admitting: Internal Medicine

## 2023-01-19 ENCOUNTER — Encounter: Payer: Self-pay | Admitting: Internal Medicine

## 2023-01-19 VITALS — BP 122/78 | HR 75 | Temp 98.0°F | Ht 66.0 in | Wt 179.0 lb

## 2023-01-19 DIAGNOSIS — E119 Type 2 diabetes mellitus without complications: Secondary | ICD-10-CM

## 2023-01-19 DIAGNOSIS — N521 Erectile dysfunction due to diseases classified elsewhere: Secondary | ICD-10-CM | POA: Diagnosis not present

## 2023-01-19 DIAGNOSIS — I2510 Atherosclerotic heart disease of native coronary artery without angina pectoris: Secondary | ICD-10-CM

## 2023-01-19 DIAGNOSIS — E782 Mixed hyperlipidemia: Secondary | ICD-10-CM

## 2023-01-19 LAB — COMPREHENSIVE METABOLIC PANEL
ALT: 16 U/L (ref 0–53)
AST: 18 U/L (ref 0–37)
Albumin: 4.7 g/dL (ref 3.5–5.2)
Alkaline Phosphatase: 44 U/L (ref 39–117)
BUN: 16 mg/dL (ref 6–23)
CO2: 31 mEq/L (ref 19–32)
Calcium: 10.3 mg/dL (ref 8.4–10.5)
Chloride: 101 mEq/L (ref 96–112)
Creatinine, Ser: 0.82 mg/dL (ref 0.40–1.50)
GFR: 92.69 mL/min (ref >60.00)
Glucose, Bld: 103 mg/dL — ABNORMAL HIGH (ref 70–99)
Potassium: 4.7 mEq/L (ref 3.5–5.1)
Sodium: 141 mEq/L (ref 135–145)
Total Bilirubin: 0.4 mg/dL (ref 0.2–1.2)
Total Protein: 8.3 g/dL (ref 6.0–8.3)

## 2023-01-19 LAB — LIPID PANEL
Cholesterol: 155 mg/dL (ref 0–200)
HDL: 47.3 mg/dL (ref >39.00)
LDL Cholesterol: 92 mg/dL (ref 0–99)
NonHDL: 107.44
Total CHOL/HDL Ratio: 3
Triglycerides: 78 mg/dL (ref 0.0–149.0)
VLDL: 15.6 mg/dL (ref 0.0–40.0)

## 2023-01-19 LAB — HEMOGLOBIN A1C: Hgb A1c MFr Bld: 6.5 % (ref 4.6–6.5)

## 2023-01-19 NOTE — Assessment & Plan Note (Signed)
Chronic Regular exercise and healthy diet encouraged Check lipid panel  Continue Crestor 10 mg daily 

## 2023-01-19 NOTE — Assessment & Plan Note (Signed)
Chronic No symptoms consistent with angina Following with cardiology Continue aspirin 81 mg daily, rosuvastatin 10 mg daily Blood pressure well controlled at home

## 2023-01-19 NOTE — Patient Instructions (Addendum)
      Blood work was ordered.   The lab is on the first floor.    Medications changes include :   none     Return in about 6 months (around 07/22/2023) for Physical Exam.

## 2023-01-19 NOTE — Assessment & Plan Note (Signed)
Chronic   Lab Results  Component Value Date   HGBA1C 6.6 (H) 06/23/2022   Sugars controlled Check A1c Continue lifestyle control Stressed regular exercise, diabetic diet

## 2023-01-19 NOTE — Assessment & Plan Note (Signed)
chronic Referral urology

## 2023-01-19 NOTE — Progress Notes (Signed)
Subjective:    Patient ID: Frank Matthews, male    DOB: 1956-06-18, 67 y.o.   MRN: [id number]     HPI Frank Matthews is here for follow up of his chronic medical problems.  1 week ago he had bad hip pain - felt like shingles pain, had low grade fever and scratchy throat -- lasted 3-4 days and went away.  Covid tests neg.    Medications and allergies reviewed with patient and updated if appropriate.  Current Outpatient Medications on File Prior to Visit  Medication Sig Dispense Refill   aspirin 81 MG tablet Take 1 tablet (81 mg total) by mouth daily.     [facility] D3 25 MCG (1000 UT) capsule TAKE 3 TABLETS DAILY. NEED OFFICE VISIT 300 capsule 0   gabapentin (NEURONTIN) 100 MG capsule Take 2 capsules (200 mg total) by mouth at bedtime as needed. 180 capsule 3   gabapentin (NEURONTIN) 300 MG capsule Take 1 capsule (300 mg total) by mouth 3 (three) times daily. 30 capsule 0   metoprolol succinate (TOPROL-XL) 25 MG 24 hr tablet Take 0.5 tablets (12.5 mg total) by mouth daily. 45 tablet 3   rosuvastatin (CRESTOR) 10 MG tablet Take 1 tablet (10 mg total) by mouth daily. 90 tablet 0   rosuvastatin (CRESTOR) 10 MG tablet Take 1 tablet (10 mg total) by mouth daily. 90 tablet 0   valACYclovir (VALTREX) 1000 MG tablet Take 1 tablet (1,000 mg total) by mouth 3 (three) times daily. 30 tablet 0   No current facility-administered medications on file prior to visit.     Review of Systems  Constitutional:  Negative for fever.  Respiratory:  Negative for cough, shortness of breath and wheezing.   Cardiovascular:  Negative for chest pain, palpitations and leg swelling.  Neurological:  Positive for dizziness (a little with head movements). Negative for light-headedness and headaches.       Objective:   Vitals:   01/19/23 1351  BP: 122/78  Pulse: 75  Temp: 98 F (36.7 C)  SpO2: 95%   BP Readings from Last 3 Encounters:  01/19/23 122/78  06/22/22 132/78  05/18/22 130/82   Wt Readings from Last  3 Encounters:  01/19/23 179 lb (81.2 kg)  06/22/22 181 lb (82.1 kg)  05/18/22 182 lb (82.6 kg)   Body mass index is 28.89 kg/m.    Physical Exam Constitutional:      General: He is not in acute distress.    Appearance: Normal appearance. He is not ill-appearing.  HENT:     Head: Normocephalic and atraumatic.  Eyes:     Conjunctiva/sclera: Conjunctivae normal.  Cardiovascular:     Rate and Rhythm: Normal rate and regular rhythm.     Heart sounds: Normal heart sounds.  Pulmonary:     Effort: Pulmonary effort is normal. No respiratory distress.     Breath sounds: Normal breath sounds. No wheezing or rales.  Musculoskeletal:     Right lower leg: No edema.     Left lower leg: No edema.  Skin:    General: Skin is warm and dry.     Findings: No rash.  Neurological:     Mental Status: He is alert. Mental status is at baseline.  Psychiatric:        Mood and Affect: Mood normal.        Lab Results  Component Value Date   WBC 7.5 06/23/2022   HGB 13.9 06/23/2022   HCT 42.2 06/23/2022  PLT 208.0 06/23/2022   GLUCOSE 117 (H) 06/23/2022   CHOL 134 06/23/2022   TRIG 114.0 06/23/2022   HDL 47.40 06/23/2022   LDLDIRECT 139.2 07/12/2013   LDLCALC 64 06/23/2022   ALT 21 06/23/2022   AST 19 06/23/2022   NA 141 06/23/2022   K 4.7 06/23/2022   CL 103 06/23/2022   CREATININE 0.76 06/23/2022   BUN 11 06/23/2022   CO2 31 06/23/2022   TSH 1.93 06/17/2021   PSA 0.14 06/23/2022   INR 1.1 (H) 07/20/2013   HGBA1C 6.6 (H) 06/23/2022   MICROALBUR <0.7 06/23/2022     Assessment & Plan:    See Problem List for Assessment and Plan of chronic medical problems.

## 2023-02-06 ENCOUNTER — Other Ambulatory Visit: Payer: Self-pay | Admitting: Cardiovascular Disease

## 2023-02-06 DIAGNOSIS — E119 Type 2 diabetes mellitus without complications: Secondary | ICD-10-CM

## 2023-02-06 DIAGNOSIS — I2510 Atherosclerotic heart disease of native coronary artery without angina pectoris: Secondary | ICD-10-CM

## 2023-02-06 DIAGNOSIS — E782 Mixed hyperlipidemia: Secondary | ICD-10-CM

## 2023-03-01 ENCOUNTER — Ambulatory Visit: Payer: 59 | Admitting: Cardiovascular Disease

## 2023-03-03 ENCOUNTER — Encounter: Payer: Self-pay | Admitting: Gastroenterology

## 2023-04-07 ENCOUNTER — Encounter: Payer: Self-pay | Admitting: Cardiovascular Disease

## 2023-04-07 ENCOUNTER — Ambulatory Visit: Payer: 59 | Attending: Cardiovascular Disease | Admitting: Cardiovascular Disease

## 2023-04-07 VITALS — BP 162/80 | HR 75 | Ht 66.0 in | Wt 178.4 lb

## 2023-04-07 DIAGNOSIS — E119 Type 2 diabetes mellitus without complications: Secondary | ICD-10-CM

## 2023-04-07 DIAGNOSIS — I2510 Atherosclerotic heart disease of native coronary artery without angina pectoris: Secondary | ICD-10-CM

## 2023-04-07 DIAGNOSIS — E782 Mixed hyperlipidemia: Secondary | ICD-10-CM

## 2023-04-07 DIAGNOSIS — I10 Essential (primary) hypertension: Secondary | ICD-10-CM

## 2023-04-07 NOTE — Patient Instructions (Signed)
Medication Instructions:  Your physician recommends that you continue on your current medications as directed. Please refer to the Current Medication list given to you today.  *If you need a refill on your cardiac medications before your next appointment, please call your pharmacy*   Lab Work: Lipids, Liver function panel in November If you have labs (blood work) drawn today and your tests are completely normal, you will receive your results only by: MyChart Message (if you have MyChart) OR A paper copy in the mail If you have any lab test that is abnormal or we need to change your treatment, we will call you to review the results.   Testing/Procedures: **Please monitor BP at home and report to [place] if trending higher than 140/39mmhg**   Follow-Up: At [facility], you and your health needs are our priority.  As part of our continuing mission to provide you with exceptional heart care, we have created designated Provider Care Teams.  These Care Teams include your primary Cardiologist (physician) and Advanced Practice Providers (APPs -  Physician Assistants and Nurse Practitioners) who all work together to provide you with the care you need, when you need it.  We recommend signing up for the patient portal called "MyChart".  Sign up information is provided on this After Visit Summary.  MyChart is used to connect with patients for Virtual Visits (Telemedicine).  Patients are able to view lab/test results, encounter notes, upcoming appointments, etc.  Non-urgent messages can be sent to your provider as well.   To learn more about what you can do with MyChart, go to [email or web address].    Your next appointment:   1 year(s)  Provider:   Tonny Bollman, MD

## 2023-04-07 NOTE — Progress Notes (Signed)
Cardiology Office Note:    Date:  04/08/2023   ID:  REESE STOCKMAN, DOB Mar 22, 1956, MRN [id number]  PCP:  Pincus Sanes, MD   [facility] Providers Cardiologist:  Tonny Bollman, MD     Referring MD: Pincus Sanes, MD   Chief Complaint  Patient presents with   Coronary Artery Disease    History of Present Illness:    Frank Matthews is a 67 y.o. male presenting for follow-up of coronary artery disease. In 2014 he presented with typical symptoms of exertional angina and underwent cardiac catheterization demonstrating severe stenosis of a small diagonal branch with no other high-grade obstructive disease in his major epicardial vessels. Medical therapy was recommended and he was started on a beta-blocker at that time. He has done quite well on medical therapy and has had no ischemic symptoms or events.   The patient is here alone today.  He is currently doing well.  Notes that his LDL cholesterol was increased when it was checked in May.  States that he had been eating a lot of red meat and he would like to try diet and exercise to bring it down to goal.  We are going to set him up for repeat labs in November of this year.  From a symptomatic perspective, he is doing very well.  He denies any chest pain, chest pressure, or shortness of breath.  He is compliant with his medications.  In the past, he was unable to tolerate the 20 mg dose of rosuvastatin, but he has done well on 10 mg.  States that his home readings on blood pressure have been in a pretty good range, less than 140/90.  Past Medical History:  Diagnosis Date   H/O idiopathic seizure    Mediterranean fever    Seizures (HCC) 4-5 years   had one time seizure, none since then per pt.    Past Surgical History:  Procedure Laterality Date   LEFT HEART CATHETERIZATION WITH CORONARY ANGIOGRAM N/A 07/23/2013   Procedure: LEFT HEART CATHETERIZATION WITH CORONARY ANGIOGRAM;  Surgeon: Micheline Chapman, MD;  Location: [facility]  CATH LAB;  Service: Cardiovascular;  Laterality: N/A;   LUMBAR DISC SURGERY     L5-S1    Current Medications: Current Meds  Medication Sig   aspirin 81 MG tablet Take 1 tablet (81 mg total) by mouth daily.   [facility] D3 25 MCG (1000 UT) capsule TAKE 3 TABLETS DAILY. NEED OFFICE VISIT   gabapentin (NEURONTIN) 100 MG capsule Take 2 capsules (200 mg total) by mouth at bedtime as needed.   gabapentin (NEURONTIN) 300 MG capsule Take 1 capsule (300 mg total) by mouth 3 (three) times daily.   metoprolol succinate (TOPROL-XL) 25 MG 24 hr tablet TAKE 1/2 TABLET BY MOUTH EVERY DAY   rosuvastatin (CRESTOR) 10 MG tablet Take 1 tablet (10 mg total) by mouth daily.   rosuvastatin (CRESTOR) 10 MG tablet Take 1 tablet (10 mg total) by mouth daily.     Allergies:   Patient has no known allergies.   Social History   Socioeconomic History   Marital status: Married    Spouse name: Not on file   Number of children: 1   Years of education: Not on file   Highest education level: Not on file  Occupational History   Occupation: restauranteur  Tobacco Use   Smoking status: Never   Smokeless tobacco: Never  Substance and Sexual Activity   Alcohol use: No   Drug use:  No   Sexual activity: Not on file  Other Topics Concern   Not on file  Social History Narrative   [employer] Landscape [employer]   Married '91   1 daughter - '96   Work - Copywriter, advertising   Social Determinants of Corporate investment banker Strain: Not on [employer] Insecurity: Not on file  Transportation Needs: Not on file  Physical Activity: Not on file  Stress: Not on file  Social Connections: Unknown (01/26/2022)   Received from [employer]   Social Network    Social Network: Not on file     Family History: The patient's family history includes Breast cancer in his sister; Diabetes in his brother; Kidney disease in his brother; Other (age of onset: 48) in his father; Other (age of onset: 66) in his mother. There is no  history of Colon cancer, Prostate cancer, Coronary artery disease, or Heart attack.  ROS:   Please see the history of present illness.    All other systems reviewed and are negative.  EKGs/Labs/Other Studies Reviewed:         Recent Labs: 06/23/2022: Hemoglobin 13.9; Platelets 208.0 01/19/2023: ALT 16; BUN 16; Creatinine, Ser 0.82; Potassium 4.7; Sodium 141  Recent Lipid Panel    Component Value Date/Time   CHOL 155 01/19/2023 1423   CHOL 129 03/02/2022 1414   TRIG 78.0 01/19/2023 1423   HDL 47.30 01/19/2023 1423   HDL 46 03/02/2022 1414   CHOLHDL 3 01/19/2023 1423   VLDL 15.6 01/19/2023 1423   LDLCALC 92 01/19/2023 1423   LDLCALC 65 03/02/2022 1414   LDLCALC 73 05/30/2020 1508   LDLDIRECT 139.2 07/12/2013 1639     Risk Assessment/Calculations:            Physical Exam:    VS:  BP (!) 162/80   Pulse 75   Ht 5\' 6"  (1.676 m)   Wt 178 lb 6.4 oz (80.9 kg)   SpO2 98%   BMI 28.79 kg/m     Wt Readings from Last 3 Encounters:  04/07/23 178 lb 6.4 oz (80.9 kg)  01/19/23 179 lb (81.2 kg)  06/22/22 181 lb (82.1 kg)     GEN:  Well nourished, well developed in no acute distress HEENT: Normal NECK: No JVD; No carotid bruits LYMPHATICS: No lymphadenopathy CARDIAC: RRR, no murmurs, rubs, gallops RESPIRATORY:  Clear to auscultation without rales, wheezing or rhonchi  ABDOMEN: Soft, non-tender, non-distended MUSCULOSKELETAL:  No edema; No deformity  SKIN: Warm and dry NEUROLOGIC:  Alert and oriented x 3 PSYCHIATRIC:  Normal affect   ASSESSMENT:    1. Mixed hyperlipidemia   2. Coronary artery disease involving native coronary artery of native heart without angina pectoris   3. Type 2 diabetes mellitus without complication, without long-term current use of insulin (HCC)   4. Essential hypertension    PLAN:    In order of problems listed above:  Treated with rosuvastatin 10 mg daily.  He will work on lifestyle modification with diet and exercise.  We will repeat  his labs in November of this year.  If his LDL is above goal of 70 mg/dL, consider addition of Zetia 10 mg daily. Stable with no symptoms of angina.  Doing well on aspirin for antiplatelet therapy, beta-blocker, and a statin drug. Followed by primary care.  Hemoglobin A1c 6.5. Blood pressure is even higher on my recheck, measured at 180/90 mmHg.  Suspect significant whitecoat component based on his home readings.  Asked him to resume  regular blood pressure checks at home and contact us if his blood pressure is ranging above 140/90 mmHg.  Would likely add amlodipine 5 mg daily if needed.           Medication Adjustments/Labs and Tests Ordered: Current medicines are reviewed at length with the patient today.  Concerns regarding medicines are outlined above.  Orders Placed This Encounter  Procedures   Lipid panel   Hepatic function panel   EKG 12-Lead   No orders of the defined types were placed in this encounter.   Patient Instructions  Medication Instructions:  Your physician recommends that you continue on your current medications as directed. Please refer to the Current Medication list given to you today.  *If you need a refill on your cardiac medications before your next appointment, please call your pharmacy*   Lab Work: Lipids, Liver function panel in November If you have labs (blood work) drawn today and your tests are completely normal, you will receive your results only by: MyChart Message (if you have MyChart) OR A paper copy in the mail If you have any lab test that is abnormal or we need to change your treatment, we will call you to review the results.   Testing/Procedures: **Please monitor BP at home and report to [place] if trending higher than 140/78mmhg**   Follow-Up: At [facility], you and your health needs are our priority.  As part of our continuing mission to provide you with exceptional heart care, we have created designated Provider Care Teams.   These Care Teams include your primary Cardiologist (physician) and Advanced Practice Providers (APPs -  Physician Assistants and Nurse Practitioners) who all work together to provide you with the care you need, when you need it.  We recommend signing up for the patient portal called "MyChart".  Sign up information is provided on this After Visit Summary.  MyChart is used to connect with patients for Virtual Visits (Telemedicine).  Patients are able to view lab/test results, encounter notes, upcoming appointments, etc.  Non-urgent messages can be sent to your provider as well.   To learn more about what you can do with MyChart, go to [email or web address].    Your next appointment:   1 year(s)  Provider:   Tonny Bollman, MD        Signed, Tonny Bollman, MD  04/08/2023 11:45 AM    [facility]

## 2023-04-08 ENCOUNTER — Encounter: Payer: Self-pay | Admitting: Cardiovascular Disease

## 2023-05-12 ENCOUNTER — Other Ambulatory Visit: Payer: Self-pay | Admitting: Cardiovascular Disease

## 2023-05-12 DIAGNOSIS — H40023 Open angle with borderline findings, high risk, bilateral: Secondary | ICD-10-CM | POA: Diagnosis not present

## 2023-05-12 DIAGNOSIS — E119 Type 2 diabetes mellitus without complications: Secondary | ICD-10-CM

## 2023-05-12 DIAGNOSIS — I2510 Atherosclerotic heart disease of native coronary artery without angina pectoris: Secondary | ICD-10-CM

## 2023-05-12 DIAGNOSIS — E782 Mixed hyperlipidemia: Secondary | ICD-10-CM

## 2023-05-12 DIAGNOSIS — H2513 Age-related nuclear cataract, bilateral: Secondary | ICD-10-CM | POA: Diagnosis not present

## 2023-05-12 DIAGNOSIS — H35033 Hypertensive retinopathy, bilateral: Secondary | ICD-10-CM | POA: Diagnosis not present

## 2023-07-25 ENCOUNTER — Encounter: Payer: Self-pay | Admitting: Internal Medicine

## 2023-07-26 ENCOUNTER — Ambulatory Visit: Payer: BC Managed Care – PPO | Attending: Cardiovascular Disease

## 2023-07-26 DIAGNOSIS — E782 Mixed hyperlipidemia: Secondary | ICD-10-CM | POA: Diagnosis not present

## 2023-07-27 LAB — LIPID PANEL
Chol/HDL Ratio: 3.1 ratio (ref 0.0–5.0)
Cholesterol, Total: 162 mg/dL (ref 100–199)
HDL: 52 mg/dL (ref >39)
LDL Chol Calc (NIH): 91 mg/dL (ref 0–99)
Triglycerides: 103 mg/dL (ref 0–149)
VLDL Cholesterol Cal: 19 mg/dL (ref 5–40)

## 2023-07-27 LAB — HEPATIC FUNCTION PANEL
ALT: 21 [IU]/L (ref 0–44)
AST: 17 [IU]/L (ref 0–40)
Albumin: 4.7 g/dL (ref 3.9–4.9)
Alkaline Phosphatase: 51 [IU]/L (ref 44–121)
Bilirubin Total: 0.4 mg/dL (ref 0.0–1.2)
Bilirubin, Direct: 0.14 mg/dL (ref 0.00–0.40)
Total Protein: 7.4 g/dL (ref 6.0–8.5)

## 2023-07-31 DIAGNOSIS — H1033 Unspecified acute conjunctivitis, bilateral: Secondary | ICD-10-CM | POA: Diagnosis not present

## 2023-08-02 ENCOUNTER — Encounter: Payer: Self-pay | Admitting: Internal Medicine

## 2023-08-02 NOTE — Progress Notes (Unsigned)
Subjective:    Patient ID: Frank Matthews, male    DOB: 12/29/1955, 67 y.o.   MRN: [id number]     HPI Frank Matthews is here for a physical exam and his chronic medical problems.   Doing well - no concerns.   Saturday right eye was itchy and swollen, the conjunctiva was red - went to urgent care and was prescribed an antibacterial drop.  Symptoms improving.     Back pain, hip pain - he stopped taking the gabapentin - his wife that it was making him anxious.  He would like to go back on the gabapentin - he does not feel it made him anxious.  BP at home - occ 160/85.  BP 125/60  -- it is more often over 140  - 1-2 times a week   Medications and allergies reviewed with patient and updated if appropriate.  Current Outpatient Medications on File Prior to Visit  Medication Sig Dispense Refill   aspirin 81 MG tablet Take 1 tablet (81 mg total) by mouth daily.     CVS D3 25 MCG (1000 UT) capsule TAKE 3 TABLETS DAILY. NEED OFFICE VISIT 300 capsule 0   gabapentin (NEURONTIN) 100 MG capsule Take 2 capsules (200 mg total) by mouth at bedtime as needed. 180 capsule 3   metoprolol succinate (TOPROL-XL) 25 MG 24 hr tablet TAKE 1/2 TABLET BY MOUTH DAILY 45 tablet 3   ofloxacin (OCUFLOX) 0.3 % ophthalmic solution 1 drop 4 (four) times daily.     rosuvastatin (CRESTOR) 10 MG tablet Take 1 tablet (10 mg total) by mouth daily. 90 tablet 0   No current facility-administered medications on file prior to visit.    Review of Systems  Constitutional:  Negative for fever.  Eyes:  Positive for redness (improving). Negative for pain and visual disturbance.  Respiratory:  Negative for cough, shortness of breath and wheezing.   Cardiovascular:  Negative for chest pain, palpitations and leg swelling.  Gastrointestinal:  Negative for abdominal pain, blood in stool, constipation and diarrhea.       No gerd  Genitourinary:  Negative for difficulty urinating, dysuria and hematuria.  Musculoskeletal:  Positive  for back pain. Negative for arthralgias.  Skin:  Negative for rash.  Neurological:  Positive for headaches (rare). Negative for light-headedness.  Psychiatric/Behavioral:  Negative for dysphoric mood. The patient is not nervous/anxious.        Objective:   Vitals:   08/03/23 1340 08/03/23 1344  BP: (!) 148/72 (!) 140/82  Pulse: 74   Temp: 98.4 F (36.9 C)   SpO2: 95%    Filed Weights   08/03/23 1340  Weight: 181 lb (82.1 kg)   Body mass index is 29.21 kg/m.  BP Readings from Last 3 Encounters:  08/03/23 (!) 140/82  04/07/23 (!) 162/80  01/19/23 122/78    Wt Readings from Last 3 Encounters:  08/03/23 181 lb (82.1 kg)  04/07/23 178 lb 6.4 oz (80.9 kg)  01/19/23 179 lb (81.2 kg)      Physical Exam Constitutional: He appears well-developed and well-nourished. No distress.  HENT:  Head: Normocephalic and atraumatic.  Right Ear: External ear normal.  Left Ear: External ear normal.  Normal ear canals and TM b/l  Mouth/Throat: Oropharynx is clear and moist. Eyes: Conjunctivae and EOM are normal.  Neck: Neck supple. No tracheal deviation present. No thyromegaly present.  No carotid bruit  Cardiovascular: Normal rate, regular rhythm, normal heart sounds and intact distal pulses.   No murmur  heard.  No lower extremity edema. Pulmonary/Chest: Effort normal and breath sounds normal. No respiratory distress. He has no wheezes. He has no rales.  Abdominal: Soft. He exhibits no distension. There is no tenderness.  Genitourinary: deferred  Lymphadenopathy:   He has no cervical adenopathy.  Skin: Skin is warm and dry. He is not diaphoretic.  Psychiatric: He has a normal mood and affect. His behavior is normal.         Assessment & Plan:   Physical exam: Screening blood work  ordered Exercise   fairly regular Weight  is ok Substance abuse   none   Reviewed recommended immunizations.  Flu and pneumonia vaccines today   Health Maintenance  Topic Date Due    OPHTHALMOLOGY EXAM  Never done   Colonoscopy  01/21/2023   INFLUENZA VACCINE  04/14/2023   Pneumonia Vaccine 77+ Years old (1 of 1 - PCV) Never done   Diabetic kidney evaluation - Urine ACR  06/24/2023   HEMOGLOBIN A1C  07/22/2023   COVID-19 Vaccine (2 - 2023-24 season) 08/19/2023 (Originally 05/15/2023)   Zoster Vaccines- Shingrix (1 of 2) 11/03/2023 (Originally 04/30/2008)   FOOT EXAM  08/18/2023   DTaP/Tdap/Td (2 - Td or Tdap) 12/14/2023   Diabetic kidney evaluation - eGFR measurement  01/19/2024   Hepatitis C Screening  Completed   HIV Screening  Completed   HPV VACCINES  Aged Out     See Problem List for Assessment and Plan of chronic medical problems.

## 2023-08-02 NOTE — Patient Instructions (Addendum)
Blood work was ordered.       Medications changes include :   None    Monitor your BP at home.    Return in about 6 months (around 01/31/2024) for follow up.   Health Maintenance, Male Adopting a healthy lifestyle and getting preventive care are important in promoting health and wellness. Ask your health care provider about: The right schedule for you to have regular tests and exams. Things you can do on your own to prevent diseases and keep yourself healthy. What should I know about diet, weight, and exercise? Eat a healthy diet  Eat a diet that includes plenty of vegetables, fruits, low-fat dairy products, and lean protein. Do not eat a lot of foods that are high in solid fats, added sugars, or sodium. Maintain a healthy weight Body mass index (BMI) is a measurement that can be used to identify possible weight problems. It estimates body fat based on height and weight. Your health care provider can help determine your BMI and help you achieve or maintain a healthy weight. Get regular exercise Get regular exercise. This is one of the most important things you can do for your health. Most adults should: Exercise for at least 150 minutes each week. The exercise should increase your heart rate and make you sweat (moderate-intensity exercise). Do strengthening exercises at least twice a week. This is in addition to the moderate-intensity exercise. Spend less time sitting. Even light physical activity can be beneficial. Watch cholesterol and blood lipids Have your blood tested for lipids and cholesterol at 64 years of age, then have this test every 5 years. You may need to have your cholesterol levels checked more often if: Your lipid or cholesterol levels are high. You are older than 65 years of age. You are at high risk for heart disease. What should I know about cancer screening? Many types of cancers can be detected early and may often be prevented. Depending on your  health history and family history, you may need to have cancer screening at various ages. This may include screening for: Colorectal cancer. Prostate cancer. Skin cancer. Lung cancer. What should I know about heart disease, diabetes, and high blood pressure? Blood pressure and heart disease High blood pressure causes heart disease and increases the risk of stroke. This is more likely to develop in people who have high blood pressure readings or are overweight. Talk with your health care provider about your target blood pressure readings. Have your blood pressure checked: Every 3-5 years if you are 54-68 years of age. Every year if you are 69 years old or older. If you are between the ages of 47 and 24 and are a current or former smoker, ask your health care provider if you should have a one-time screening for abdominal aortic aneurysm (AAA). Diabetes Have regular diabetes screenings. This checks your fasting blood sugar level. Have the screening done: Once every three years after age 54 if you are at a normal weight and have a low risk for diabetes. More often and at a younger age if you are overweight or have a high risk for diabetes. What should I know about preventing infection? Hepatitis B If you have a higher risk for hepatitis B, you should be screened for this virus. Talk with your health care provider to find out if you are at risk for hepatitis B infection. Hepatitis C Blood testing is recommended for: Everyone born from [birth year] through 1963. Anyone with known  risk factors for hepatitis C. Sexually transmitted infections (STIs) You should be screened each year for STIs, including gonorrhea and chlamydia, if: You are sexually active and are younger than 65 years of age. You are older than 65 years of age and your health care provider tells you that you are at risk for this type of infection. Your sexual activity has changed since you were last screened, and you are at increased risk  for chlamydia or gonorrhea. Ask your health care provider if you are at risk. Ask your health care provider about whether you are at high risk for HIV. Your health care provider may recommend a prescription medicine to help prevent HIV infection. If you choose to take medicine to prevent HIV, you should first get tested for HIV. You should then be tested every 3 months for as long as you are taking the medicine. Follow these instructions at home: Alcohol use Do not drink alcohol if your health care provider tells you not to drink. If you drink alcohol: Limit how much you have to 0-2 drinks a day. Know how much alcohol is in your drink. In the [place] one drink equals one 12 oz bottle of beer (355 mL), one 5 oz glass of wine (148 mL), or one 1 oz glass of hard liquor (44 mL). Lifestyle Do not use any products that contain nicotine or tobacco. These products include cigarettes, chewing tobacco, and vaping devices, such as e-cigarettes. If you need help quitting, ask your health care provider. Do not use street drugs. Do not share needles. Ask your health care provider for help if you need support or information about quitting drugs. General instructions Schedule regular health, dental, and eye exams. Stay current with your vaccines. Tell your health care provider if: You often feel depressed. You have ever been abused or do not feel safe at home. Summary Adopting a healthy lifestyle and getting preventive care are important in promoting health and wellness. Follow your health care provider's instructions about healthy diet, exercising, and getting tested or screened for diseases. Follow your health care provider's instructions on monitoring your cholesterol and blood pressure. This information is not intended to replace advice given to you by your health care provider. Make sure you discuss any questions you have with your health care provider. Document Revised: 01/19/2021 Document Reviewed:  01/19/2021 Elsevier Patient Education  2024 [employer].

## 2023-08-03 ENCOUNTER — Ambulatory Visit (INDEPENDENT_AMBULATORY_CARE_PROVIDER_SITE_OTHER): Payer: Medicare Other | Admitting: Internal Medicine

## 2023-08-03 VITALS — BP 140/82 | HR 74 | Temp 98.4°F | Ht 66.0 in | Wt 181.0 lb

## 2023-08-03 DIAGNOSIS — Z23 Encounter for immunization: Secondary | ICD-10-CM

## 2023-08-03 DIAGNOSIS — M48062 Spinal stenosis, lumbar region with neurogenic claudication: Secondary | ICD-10-CM

## 2023-08-03 DIAGNOSIS — E782 Mixed hyperlipidemia: Secondary | ICD-10-CM | POA: Diagnosis not present

## 2023-08-03 DIAGNOSIS — E1159 Type 2 diabetes mellitus with other circulatory complications: Secondary | ICD-10-CM | POA: Insufficient documentation

## 2023-08-03 DIAGNOSIS — Z1211 Encounter for screening for malignant neoplasm of colon: Secondary | ICD-10-CM

## 2023-08-03 DIAGNOSIS — I10 Essential (primary) hypertension: Secondary | ICD-10-CM

## 2023-08-03 DIAGNOSIS — I2510 Atherosclerotic heart disease of native coronary artery without angina pectoris: Secondary | ICD-10-CM

## 2023-08-03 DIAGNOSIS — Z0000 Encounter for general adult medical examination without abnormal findings: Secondary | ICD-10-CM | POA: Diagnosis not present

## 2023-08-03 DIAGNOSIS — Z125 Encounter for screening for malignant neoplasm of prostate: Secondary | ICD-10-CM

## 2023-08-03 DIAGNOSIS — E119 Type 2 diabetes mellitus without complications: Secondary | ICD-10-CM

## 2023-08-03 DIAGNOSIS — E559 Vitamin D deficiency, unspecified: Secondary | ICD-10-CM

## 2023-08-03 MED ORDER — GABAPENTIN 100 MG PO CAPS: 200.0000 mg | ORAL_CAPSULE | Freq: Every evening | ORAL | 3 refills | Status: DC | PRN

## 2023-08-03 NOTE — Assessment & Plan Note (Addendum)
Chronic No symptoms consistent with angina Following with cardiology Continue aspirin 81 mg daily, rosuvastatin 10 mg daily, metoprolol 12.5 mg daily Blood pressure not always controlled at home-he plans on monitoring closely for 2 weeks and updating me so that we can adjust medication if needed

## 2023-08-03 NOTE — Assessment & Plan Note (Signed)
Chronic Taking vitamin D daily 

## 2023-08-03 NOTE — Assessment & Plan Note (Signed)
Chronic Regular exercise and healthy diet encouraged Check lipid panel  Continue Crestor 10 mg daily 

## 2023-08-03 NOTE — Assessment & Plan Note (Signed)
Chronic BP not well controlled - elevated here and has been frequently high at home Continue metoprolol 12.5 mg daily Bmp He will monitor his BP x 2 weeks and let me know his readings - may need to increase the metoprolol

## 2023-08-03 NOTE — Assessment & Plan Note (Addendum)
Chronic Following with neurosurgery Back pain improved some He is exercising regularly Not taking gabapentin - will restart 200 mg HS

## 2023-08-03 NOTE — Assessment & Plan Note (Signed)
Chronic   Lab Results  Component Value Date   HGBA1C 6.5 01/19/2023   Sugars controlled Check A1c, urine microalbumin Continue lifestyle control Stressed regular exercise, diabetic diet

## 2023-08-08 ENCOUNTER — Other Ambulatory Visit (INDEPENDENT_AMBULATORY_CARE_PROVIDER_SITE_OTHER): Payer: Medicare Other

## 2023-08-08 DIAGNOSIS — Z79899 Other long term (current) drug therapy: Secondary | ICD-10-CM

## 2023-08-08 DIAGNOSIS — Z125 Encounter for screening for malignant neoplasm of prostate: Secondary | ICD-10-CM | POA: Diagnosis not present

## 2023-08-08 DIAGNOSIS — I2510 Atherosclerotic heart disease of native coronary artery without angina pectoris: Secondary | ICD-10-CM

## 2023-08-08 DIAGNOSIS — E119 Type 2 diabetes mellitus without complications: Secondary | ICD-10-CM | POA: Diagnosis not present

## 2023-08-08 DIAGNOSIS — E782 Mixed hyperlipidemia: Secondary | ICD-10-CM

## 2023-08-08 LAB — CBC WITH DIFFERENTIAL/PLATELET
Basophils Absolute: 0 10*3/uL (ref 0.0–0.1)
Basophils Relative: 0.7 % (ref 0.0–3.0)
Eosinophils Absolute: 0.3 10*3/uL (ref 0.0–0.7)
Eosinophils Relative: 4.6 % (ref 0.0–5.0)
HCT: 44.2 % (ref 39.0–52.0)
Hemoglobin: 14.7 g/dL (ref 13.0–17.0)
Lymphocytes Relative: 20.4 % (ref 12.0–46.0)
Lymphs Abs: 1.3 10*3/uL (ref 0.7–4.0)
MCHC: 33.3 g/dL (ref 30.0–36.0)
MCV: 87.7 fL (ref 78.0–100.0)
Monocytes Absolute: 0.6 10*3/uL (ref 0.1–1.0)
Monocytes Relative: 8.6 % (ref 3.0–12.0)
Neutro Abs: 4.2 10*3/uL (ref 1.4–7.7)
Neutrophils Relative %: 65.7 % (ref 43.0–77.0)
Platelets: 183 10*3/uL (ref 150.0–400.0)
RBC: 5.04 Mil/uL (ref 4.22–5.81)
RDW: 13.7 % (ref 11.5–15.5)
WBC: 6.4 10*3/uL (ref 4.0–10.5)

## 2023-08-08 LAB — HEMOGLOBIN A1C: Hgb A1c MFr Bld: 6.5 % (ref 4.6–6.5)

## 2023-08-08 LAB — BASIC METABOLIC PANEL
BUN: 14 mg/dL (ref 6–23)
CO2: 31 meq/L (ref 19–32)
Calcium: 10.2 mg/dL (ref 8.4–10.5)
Chloride: 102 meq/L (ref 96–112)
Creatinine, Ser: 0.88 mg/dL (ref 0.40–1.50)
GFR: 90.39 mL/min (ref >60.00)
Glucose, Bld: 127 mg/dL — ABNORMAL HIGH (ref 70–99)
Potassium: 4.4 meq/L (ref 3.5–5.1)
Sodium: 140 meq/L (ref 135–145)

## 2023-08-08 LAB — MICROALBUMIN / CREATININE URINE RATIO
Creatinine,U: 71.1 mg/dL
Microalb Creat Ratio: 1 mg/g (ref 0.0–30.0)
Microalb, Ur: <0.7 mg/dL (ref 0.0–1.9)

## 2023-08-08 LAB — PSA, MEDICARE: PSA: 0.23 ng/mL (ref 0.10–4.00)

## 2023-08-09 ENCOUNTER — Other Ambulatory Visit: Payer: Self-pay | Admitting: Cardiovascular Disease

## 2023-08-09 DIAGNOSIS — I2510 Atherosclerotic heart disease of native coronary artery without angina pectoris: Secondary | ICD-10-CM

## 2023-08-09 DIAGNOSIS — E782 Mixed hyperlipidemia: Secondary | ICD-10-CM

## 2023-08-09 DIAGNOSIS — E119 Type 2 diabetes mellitus without complications: Secondary | ICD-10-CM

## 2023-08-09 MED ORDER — EZETIMIBE 10 MG PO TABS: 10.0000 mg | ORAL_TABLET | Freq: Every day | ORAL | 3 refills | Status: DC

## 2023-08-18 ENCOUNTER — Ambulatory Visit (INDEPENDENT_AMBULATORY_CARE_PROVIDER_SITE_OTHER): Payer: BC Managed Care – PPO | Admitting: Podiatry

## 2023-08-18 DIAGNOSIS — Z91199 Patient's noncompliance with other medical treatment and regimen due to unspecified reason: Secondary | ICD-10-CM

## 2023-08-20 NOTE — Progress Notes (Signed)
Patient was no-show for appointment today 

## 2023-08-30 ENCOUNTER — Emergency Department (HOSPITAL_BASED_OUTPATIENT_CLINIC_OR_DEPARTMENT_OTHER): Payer: Medicare Other

## 2023-08-30 ENCOUNTER — Emergency Department (HOSPITAL_BASED_OUTPATIENT_CLINIC_OR_DEPARTMENT_OTHER)
Admission: EM | Admit: 2023-08-30 | Discharge: 2023-08-30 | Disposition: A | Discharge status: Home / Self Care | Payer: Medicare Other | Attending: Emergency Medicine | Admitting: Emergency Medicine

## 2023-08-30 ENCOUNTER — Encounter (HOSPITAL_BASED_OUTPATIENT_CLINIC_OR_DEPARTMENT_OTHER): Payer: Self-pay | Admitting: Urology

## 2023-08-30 DIAGNOSIS — Z23 Encounter for immunization: Secondary | ICD-10-CM | POA: Insufficient documentation

## 2023-08-30 DIAGNOSIS — S91051A Open bite, right ankle, initial encounter: Secondary | ICD-10-CM | POA: Diagnosis not present

## 2023-08-30 DIAGNOSIS — M7989 Other specified soft tissue disorders: Secondary | ICD-10-CM | POA: Diagnosis not present

## 2023-08-30 DIAGNOSIS — W540XXA Bitten by dog, initial encounter: Secondary | ICD-10-CM | POA: Insufficient documentation

## 2023-08-30 DIAGNOSIS — S81851A Open bite, right lower leg, initial encounter: Secondary | ICD-10-CM | POA: Diagnosis not present

## 2023-08-30 MED ORDER — AMOXICILLIN-POT CLAVULANATE 875-125 MG PO TABS: 1.0000 | ORAL_TABLET | Freq: Two times a day (BID) | ORAL | 0 refills | Status: DC

## 2023-08-30 MED ORDER — TETANUS-DIPHTH-ACELL PERTUSSIS 5-2.5-18.5 LF-MCG/0.5 IM SUSY
0.5000 mL | PREFILLED_SYRINGE | Freq: Once | INTRAMUSCULAR | Status: AC
  Administered 2023-08-30: 0.5 mL via INTRAMUSCULAR
  Filled 2023-08-30: qty 0.5

## 2023-08-30 MED ORDER — AMOXICILLIN-POT CLAVULANATE 875-125 MG PO TABS
1.0000 | ORAL_TABLET | Freq: Once | ORAL | Status: AC
  Administered 2023-08-30: 1 via ORAL
  Filled 2023-08-30: qty 1

## 2023-08-30 NOTE — ED Provider Notes (Signed)
[place] [facility] Provider Note   CSN: [id number] Arrival date & time: 08/30/23  1518     History  Chief Complaint  Patient presents with   Animal Bite    Frank Matthews is a 67 y.o. male presents to the ER for a dog bite on his right ankle approximately 1 hour prior to arrival.  Patient does note pain with weightbearing.  Per patient dog's vaccination status is unknown as is his last Tdap.  Patient has no other complaints at this time.   Animal Bite      Home Medications Prior to Admission medications   Medication Sig Start Date End Date Taking? Authorizing Provider  amoxicillin-clavulanate (AUGMENTIN) 875-125 MG tablet Take 1 tablet by mouth every 12 (twelve) hours. 08/30/23  Yes Dolphus Jenny, PA-C  aspirin 81 MG tablet Take 1 tablet (81 mg total) by mouth daily. 08/06/14   Tonny Bollman, MD  [facility] D3 25 MCG (1000 UT) capsule TAKE 3 TABLETS DAILY. NEED OFFICE VISIT 05/01/19   Pincus Sanes, MD  ezetimibe (ZETIA) 10 MG tablet Take 1 tablet (10 mg total) by mouth daily. 08/09/23   Tonny Bollman, MD  gabapentin (NEURONTIN) 100 MG capsule Take 2 capsules (200 mg total) by mouth at bedtime as needed. 08/03/23   Pincus Sanes, MD  metoprolol succinate (TOPROL-XL) 25 MG 24 hr tablet TAKE 1/2 TABLET BY MOUTH DAILY 05/12/23   Tonny Bollman, MD  ofloxacin (OCUFLOX) 0.3 % ophthalmic solution 1 drop 4 (four) times daily. 07/31/23   [provider]  rosuvastatin (CRESTOR) 10 MG tablet TAKE 1 TABLET BY MOUTH EVERY DAY 08/10/23   Tonny Bollman, MD      Allergies    Patient has no known allergies.    Review of Systems   Review of Systems  Musculoskeletal:  Positive for arthralgias.    Physical Exam Updated Vital Signs BP (!) 156/79 (BP Location: Left Arm)   Pulse 90   Temp 97.7 F (36.5 C)   Resp 18   Ht 5\' 6"  (1.676 m)   Wt 82.1 kg   SpO2 90%   BMI 29.21 kg/m  Physical Exam Vitals and nursing note reviewed.   Constitutional:      General: He is not in acute distress.    Appearance: He is well-developed.  HENT:     Head: Normocephalic and atraumatic.  Eyes:     Conjunctiva/sclera: Conjunctivae normal.  Cardiovascular:     Rate and Rhythm: Normal rate and regular rhythm.     Heart sounds: No murmur heard. Pulmonary:     Effort: Pulmonary effort is normal. No respiratory distress.     Breath sounds: Normal breath sounds.  Abdominal:     Palpations: Abdomen is soft.     Tenderness: There is no abdominal tenderness.  Musculoskeletal:     Cervical back: Neck supple.     Comments: Patient has mild swelling to the lateral right ankle with approximately 3-4 superficial dog bite wounds that are hemostatic.  Patient is neurovascularly intact  Skin:    General: Skin is warm and dry.     Capillary Refill: Capillary refill takes less than 2 seconds.  Neurological:     Mental Status: He is alert.  Psychiatric:        Mood and Affect: Mood normal.     ED Results / Procedures / Treatments   Labs (all labs ordered are listed, but only abnormal results are displayed) Labs Reviewed -  No data to display  EKG None  Radiology DG Ankle Complete Right Result Date: 08/30/2023 CLINICAL DATA:  Dog bite to right ankle. EXAM: RIGHT ANKLE - COMPLETE 3+ VIEW COMPARISON:  None Available. FINDINGS: Mild swelling about the ankle. No acute fracture or dislocation. No radiopaque foreign body. IMPRESSION: No acute fracture. Electronically Signed   By: Minerva Fester M.D.   On: 08/30/2023 17:36    Procedures Procedures    Medications Ordered in ED Medications  amoxicillin-clavulanate (AUGMENTIN) 875-125 MG per tablet 1 tablet (1 tablet Oral Given 08/30/23 1850)  Tdap (BOOSTRIX) injection 0.5 mL (0.5 mLs Intramuscular Given 08/30/23 1851)    ED Course/ Medical Decision Making/ A&P                                 Medical Decision Making Amount and/or Complexity of Data Reviewed Radiology:  ordered.   This patient presents to the ED with chief complaint(s) of dog bite with pertinent past medical history of none which further complicates the presenting complaint. The complaint involves an extensive differential diagnosis and also carries with it a high risk of complications and morbidity.    The differential diagnosis includes laceration, dog bite, foot fracture  ED Course and Reassessment: [place] police officer present and states that a delayed call to animal control will be done and they will follow-up with the dog and contact the patient if further treatment is needed. Patient's Tdap updated and given first dose of Augmentin. Wound irrigated and dressed.  Independent visualization of imaging: - I independently visualized the following imaging with scope of interpretation limited to determining acute life threatening conditions related to emergency care: Right ankle x-ray, which revealed no acute fracture  Consultation: - Consulted or discussed management/test interpretation w/ external professional: None  Consideration for admission or further workup: Considered for admission or further workup however patient's vital signs, physical exam, and imaging of all been reassuring.  Patient will be given outpatient course of Augmentin and should follow-up with PCP in approximately 1 week.         Final Clinical Impression(s) / ED Diagnoses Final diagnoses:  Dog bite, initial encounter    Rx / DC Orders ED Discharge Orders          Ordered    amoxicillin-clavulanate (AUGMENTIN) 875-125 MG tablet  Every 12 hours        08/30/23 1824              Dolphus Jenny, PA-C 08/30/23 1914    Tegeler, Canary Brim, MD 08/30/23 2219

## 2023-08-30 NOTE — Discharge Instructions (Addendum)
Today you are seen after a dog bite.  Please pick up your antibiotic and take as prescribed.  Thank you for letting us treat you today. After performing a physical exam and reviewing your imaging, I feel you are safe to go home. Please follow up with your PCP in the next several days and provide them with your records from this visit. Return to the Emergency Room if pain becomes severe or symptoms worsen.

## 2023-08-30 NOTE — ED Triage Notes (Addendum)
Pt states dog bite to right ankle approx 1 hr PTA, pain with weight bearing  Neighbor dog vaccinated, record in hand Puncture wounds noted   TDAP unknown

## 2023-08-30 NOTE — ED Notes (Signed)
Pt states dog shot record he has is invalid and state dog rabies not up to date

## 2023-09-01 ENCOUNTER — Ambulatory Visit: Payer: Self-pay | Admitting: Internal Medicine

## 2023-09-01 ENCOUNTER — Encounter: Payer: Self-pay | Admitting: Internal Medicine

## 2023-09-01 NOTE — Telephone Encounter (Signed)
 Care team updated and letter sent for eye exam notes.

## 2023-09-01 NOTE — Telephone Encounter (Signed)
   Chief Complaint: ankle injury Symptoms: swelling from right ankle to foot, 3 puncture wounds on ankle, bruising on ankle and foot, pain Frequency: 3 days ago Pertinent Negatives: Patient denies fever, drainage,  Disposition: [] ED /[] Urgent Care (no appt availability in office) / [x] Appointment(In office/virtual)/ []  [place] [facility]/ [] Home Care/ [] Refused Recommended Disposition /[]  Mobile Bus/ []  Follow-up with PCP Additional Notes: Patient reports he was bitten by 2 dogs on his right ankle 3 days ago. Patient reports he did go to the ED at this time and verified no broken bones. Patient reports he has had increased swelling from right ankle to foot as well as bruising. Patient reports it is painful to put pressure on and is causing him to limp. Per protocol, appt scheduled 12/20. Patient advised to call back if symptoms worsen before appt. Patient verbalized understanding.    Copied from CRM [id number]. Topic: Clinical - Red Word Triage >> Sep 01, 2023 12:03 PM Adele Barthel wrote: Red Word that prompted transfer to Nurse Triage: Pt experienced a dog bite of right ankle 3 days ago and was seen in [facility] ER. Was advised to see primary within 7 days, due to history of cardiac issues and prediabetes. Pt is experiencing swelling and areas of dark skin around the injury. Reason for Disposition  Large swelling or bruise (> 2 inches or 5 cm)  Answer Assessment - Initial Assessment Questions 1. MECHANISM: "How did the injury happen?" (e.g., twisting injury, direct blow)      Bitten by 2 dogs 2. ONSET: "When did the injury happen?" (Minutes or hours ago)      3 days ago 3. LOCATION: "Where is the injury located?"      Right ankle 4. APPEARANCE of INJURY: "What does the injury look like?"      Swelling into foot, purple bruising 5. WEIGHT-BEARING: "Can you put weight on that foot?" "Can you walk (four steps or more)?"       Painful to put pressure on, limping 6. SIZE: For cuts,  bruises, or swelling, ask: "How large is it?" (e.g., inches or centimeters;  entire joint)      Swelling and bruising from ankle to foot, 3 small cuts penny size 7. PAIN: "Is there pain?" If Yes, ask: "How bad is the pain?"    (e.g., Scale 1-10; or mild, moderate, severe)   - NONE (0): no pain.   - MILD (1-3): doesn't interfere with normal activities.    - MODERATE (4-7): interferes with normal activities (e.g., work or school) or awakens from sleep, limping.    - SEVERE (8-10): excruciating pain, unable to do any normal activities, unable to walk.      moderate 8. TETANUS: For any breaks in the skin, ask: "When was the last tetanus booster?"     yes 9. OTHER SYMPTOMS: "Do you have any other symptoms?"      no  Protocols used: Ankle and Foot Injury-A-AH

## 2023-09-02 ENCOUNTER — Ambulatory Visit (INDEPENDENT_AMBULATORY_CARE_PROVIDER_SITE_OTHER): Payer: Medicare Other | Admitting: Internal Medicine

## 2023-09-02 ENCOUNTER — Encounter: Payer: Self-pay | Admitting: Internal Medicine

## 2023-09-02 VITALS — BP 128/80 | HR 77 | Temp 98.5°F | Ht 66.0 in | Wt 181.0 lb

## 2023-09-02 DIAGNOSIS — M25571 Pain in right ankle and joints of right foot: Secondary | ICD-10-CM | POA: Diagnosis not present

## 2023-09-02 NOTE — Progress Notes (Signed)
   Subjective:   Patient ID: Frank Matthews, male    DOB: [birth year], 67 y.o.   MRN: [id number]  HPI The patient is a 67 YO man coming in for follow up ER (in for dog bite and given Tdap and antibiotics). He is taking these and caring for wound. Still painful and wants wound check given diabetes.   Review of Systems  Constitutional: Negative.   HENT: Negative.    Eyes: Negative.   Respiratory:  Negative for cough, chest tightness and shortness of breath.   Cardiovascular:  Negative for chest pain, palpitations and leg swelling.  Gastrointestinal:  Negative for abdominal distention, abdominal pain, constipation, diarrhea, nausea and vomiting.  Musculoskeletal: Negative.   Skin:  Positive for wound.  Neurological: Negative.   Psychiatric/Behavioral: Negative.      Objective:  Physical Exam Constitutional:      Appearance: He is well-developed.  HENT:     Head: Normocephalic and atraumatic.  Cardiovascular:     Rate and Rhythm: Normal rate and regular rhythm.  Pulmonary:     Effort: Pulmonary effort is normal. No respiratory distress.     Breath sounds: Normal breath sounds. No wheezing or rales.  Abdominal:     General: Bowel sounds are normal. There is no distension.     Palpations: Abdomen is soft.     Tenderness: There is no abdominal tenderness. There is no rebound.  Musculoskeletal:     Cervical back: Normal range of motion.  Skin:    General: Skin is warm and dry.     Comments: 4 distinct wounds on lateral and medial sides of ankle. 3 appear to be scrapes and with early scabbing no signs of infection. Last wound is puncture type but shallow and no signs of infection  Neurological:     Mental Status: He is alert and oriented to person, place, and time.     Coordination: Coordination normal.     Vitals:   09/02/23 0840  BP: 128/80  Pulse: 77  Temp: 98.5 F (36.9 C)  TempSrc: Oral  SpO2: 96%  Weight: 181 lb (82.1 kg)  Height: 5\' 6"  (1.676 m)    Assessment &  Plan:  Visit time 15 minutes in face to face communication with patient and coordination of care, additional 5 minutes spent in record review, coordination or care, ordering tests, communicating/referring to other healthcare professionals, documenting in medical records all on the same day of the visit for total time 20 minutes spent on the visit.

## 2023-09-02 NOTE — Assessment & Plan Note (Signed)
With dog bite through clothes. He is taking 1 week augmentin and tolerating well. Wound check without signs of infection and healing appropriately at day 3. Counseled about wound care and monitoring for signs of infection.

## 2023-09-05 LAB — HM DIABETES EYE EXAM

## 2023-09-16 ENCOUNTER — Ambulatory Visit: Payer: Self-pay | Admitting: Internal Medicine

## 2023-09-16 NOTE — Telephone Encounter (Signed)
 Copied from CRM [id number]. Topic: Clinical - Red Word Triage >> Sep 16, 2023 11:35 AM Cherylynn B wrote: Red Word that prompted transfer to Nurse Triage: Dog bite wound on 08/30/23 not healing. Currently bleeding, painful, tingling, and possibly infected. Followed up with PCP on 09/02/23 but still not healing properly.  Chief Complaint: dog bite Symptoms: red, bleeding, swollen, one head Frequency: constant Pertinent Negatives: Patient denies fever. Disposition: [x] ED /[] Urgent Care (no appt availability in office) / [] Appointment(In office/virtual)/ []  [place] [facility]/ [] Home Care/ [] Refused Recommended Disposition /[] [place] [facility]/ []  Follow-up with PCP Additional Notes: States finished antibiotic but wound still looks infected. Patient states wound is bleeding, red, swollen and has a head on it.  Instructed to go to ER.  Apt. Was made to see a provider on Monday.  Reason for Disposition  [1] Major bleeding (e.g., actively dripping or spurting) AND [2] can't be stopped  Answer Assessment - Initial Assessment Questions 1. ANIMAL: What type of animal caused the bite? Is the injury from a bite or a claw? If the animal is a dog or a cat, ask: Was it a pet or a stray? Was it acting ill or behaving strangely?     Was bit by a dog on 08/30/23 2. LOCATION: Where is the bite located?      Right ankle 3. SIZE: How big is the bite? What does it look like?      Couple of inches, bruised and one round head, swollen and bleeding.  6. TETANUS: When was your last tetanus booster?     yes 7. RABIES VACCINE: For dog or cat bites, ask: Do you know if the pet is vaccinated against rabies?  (e.g., yes, no, overdue for rabies shot, unknown)     Dog did not have rabies, but did do the shots.  Protocols used: Animal Bite-A-AH

## 2023-09-17 ENCOUNTER — Other Ambulatory Visit: Payer: Self-pay

## 2023-09-17 ENCOUNTER — Encounter (HOSPITAL_BASED_OUTPATIENT_CLINIC_OR_DEPARTMENT_OTHER): Payer: Self-pay

## 2023-09-17 ENCOUNTER — Emergency Department (HOSPITAL_BASED_OUTPATIENT_CLINIC_OR_DEPARTMENT_OTHER)
Admission: EM | Admit: 2023-09-17 | Discharge: 2023-09-17 | Disposition: A | Discharge status: Home / Self Care | Payer: Medicare Other | Attending: Emergency Medicine | Admitting: Emergency Medicine

## 2023-09-17 DIAGNOSIS — W540XXA Bitten by dog, initial encounter: Secondary | ICD-10-CM | POA: Insufficient documentation

## 2023-09-17 DIAGNOSIS — L0390 Cellulitis, unspecified: Secondary | ICD-10-CM

## 2023-09-17 DIAGNOSIS — L03116 Cellulitis of left lower limb: Secondary | ICD-10-CM | POA: Diagnosis not present

## 2023-09-17 DIAGNOSIS — E119 Type 2 diabetes mellitus without complications: Secondary | ICD-10-CM | POA: Diagnosis not present

## 2023-09-17 DIAGNOSIS — Z7982 Long term (current) use of aspirin: Secondary | ICD-10-CM | POA: Diagnosis not present

## 2023-09-17 DIAGNOSIS — S91001A Unspecified open wound, right ankle, initial encounter: Secondary | ICD-10-CM | POA: Diagnosis present

## 2023-09-17 MED ORDER — DOXYCYCLINE HYCLATE 100 MG PO CAPS: 100.0000 mg | ORAL_CAPSULE | Freq: Two times a day (BID) | ORAL | 0 refills | Status: AC

## 2023-09-17 MED ORDER — AMOXICILLIN-POT CLAVULANATE 875-125 MG PO TABS
1.0000 | ORAL_TABLET | Freq: Once | ORAL | Status: AC
  Administered 2023-09-17: 1 via ORAL
  Filled 2023-09-17: qty 1

## 2023-09-17 MED ORDER — DOXYCYCLINE HYCLATE 100 MG PO TABS
100.0000 mg | ORAL_TABLET | Freq: Once | ORAL | Status: AC
  Administered 2023-09-17: 100 mg via ORAL
  Filled 2023-09-17: qty 1

## 2023-09-17 MED ORDER — AMOXICILLIN-POT CLAVULANATE 875-125 MG PO TABS: 1.0000 | ORAL_TABLET | Freq: Two times a day (BID) | ORAL | 0 refills | Status: DC

## 2023-09-17 NOTE — Discharge Instructions (Signed)
 Take next dose of antibiotics tomorrow.  Continue bacitracin ointment twice daily over to your wound.  Wash daily with soap and water as well.  Follow-up with your primary care doctor.  Return if symptoms worsen.

## 2023-09-17 NOTE — ED Notes (Signed)

## 2023-09-17 NOTE — ED Provider Notes (Signed)
 [place] [facility] Provider Note   CSN: [id number] Arrival date & time: 09/17/23  1431     History  Chief Complaint  Patient presents with   Wound Check    Frank Matthews is a 68 y.o. male.  Patient here for wound check of right ankle wound.  He finished a course of Augmentin  a while back for dog bite.  Shots have been updated.  Dog was quarantined wound was okay.  He denies any fevers or chills.  Does have a history of diabetes.  Denies any weakness numbness tingling.  Still has a little bit of irritation and drainage from the wound.  The history is provided by the patient.       Home Medications Prior to Admission medications   Medication Sig Start Date End Date Taking? Authorizing Provider  amoxicillin -clavulanate (AUGMENTIN ) 875-125 MG tablet Take 1 tablet by mouth every 12 (twelve) hours. 09/17/23  Yes Laymond Postle, DO  doxycycline  (VIBRAMYCIN ) 100 MG capsule Take 1 capsule (100 mg total) by mouth 2 (two) times daily for 7 days. 09/17/23 09/24/23 Yes Loriana Samad, DO  aspirin  81 MG tablet Take 1 tablet (81 mg total) by mouth daily. 08/06/14   Wonda Sharper, MD  [facility]S D3 25 MCG (1000 UT) capsule TAKE 3 TABLETS DAILY. NEED OFFICE VISIT 05/01/19   Geofm Glade PARAS, MD  ezetimibe  (ZETIA ) 10 MG tablet Take 1 tablet (10 mg total) by mouth daily. 08/09/23   Wonda Sharper, MD  gabapentin  (NEURONTIN ) 100 MG capsule Take 2 capsules (200 mg total) by mouth at bedtime as needed. 08/03/23   Geofm Glade PARAS, MD  metoprolol  succinate (TOPROL -XL) 25 MG 24 hr tablet TAKE 1/2 TABLET BY MOUTH DAILY 05/12/23   Cooper, Michael, MD  ofloxacin (OCUFLOX) 0.3 % ophthalmic solution 1 drop 4 (four) times daily. 07/31/23   [provider]  rosuvastatin  (CRESTOR ) 10 MG tablet TAKE 1 TABLET BY MOUTH EVERY DAY 08/10/23   Wonda Sharper, MD      Allergies    Patient has no known allergies.    Review of Systems   Review of Systems  Physical Exam Updated  Vital Signs BP (!) 157/73 (BP Location: Left Arm)   Pulse 72   Temp 97.6 F (36.4 C) (Oral)   Resp 16   Ht 5' 7 (1.702 m)   Wt 81.6 kg   SpO2 94%   BMI 28.19 kg/m  Physical Exam Vitals and nursing note reviewed.  Constitutional:      General: He is not in acute distress.    Appearance: He is well-developed.  HENT:     Head: Normocephalic and atraumatic.  Eyes:     Conjunctiva/sclera: Conjunctivae normal.  Cardiovascular:     Rate and Rhythm: Normal rate and regular rhythm.     Pulses: Normal pulses.     Heart sounds: No murmur heard. Pulmonary:     Effort: Pulmonary effort is normal. No respiratory distress.     Breath sounds: Normal breath sounds.  Abdominal:     Palpations: Abdomen is soft.     Tenderness: There is no abdominal tenderness.  Musculoskeletal:        General: No swelling.     Cervical back: Neck supple.  Skin:    General: Skin is warm and dry.     Capillary Refill: Capillary refill takes less than 2 seconds.     Comments: Overall small area of cellulitis to the left ankle.  There is no  major fluctuance or major drainage  Neurological:     Mental Status: He is alert.  Psychiatric:        Mood and Affect: Mood normal.     ED Results / Procedures / Treatments   Labs (all labs ordered are listed, but only abnormal results are displayed) Labs Reviewed - No data to display  EKG None  Radiology No results found.  Procedures Procedures    Medications Ordered in ED Medications  amoxicillin -clavulanate (AUGMENTIN ) 875-125 MG per tablet 1 tablet (has no administration in time range)  doxycycline  (VIBRA -TABS) tablet 100 mg (has no administration in time range)    ED Course/ Medical Decision Making/ A&P                                 Medical Decision Making Risk Prescription drug management.   Frank Matthews is here for wound check on dog bite area in the right ankle.  Still has a small area of cellulitis.  I do not have any concern for  abscess or deep space infectious process.  He has had his tetanus shot updated.  Dog was quarantined and was okay.  Overall still small area of cellulitis could be now secondary bacterial MRSA process.  Will do Augmentin  and doxycycline  have him follow-up with his primary care doctor.  He can continue bacitracin topically as well.  Wound care instructions given otherwise.  Return precautions given.  He is neurovascular neuromuscular intact.  Discharge.  This chart was dictated using voice recognition software.  Despite best efforts to proofread,  errors can occur which can change the documentation meaning.         Final Clinical Impression(s) / ED Diagnoses Final diagnoses:  Cellulitis, unspecified cellulitis site    Rx / DC Orders ED Discharge Orders          Ordered    amoxicillin -clavulanate (AUGMENTIN ) 875-125 MG tablet  Every 12 hours        09/17/23 1657    doxycycline  (VIBRAMYCIN ) 100 MG capsule  2 times daily        09/17/23 1657              Marrio Scribner, Juliene, DO 09/17/23 1702

## 2023-09-17 NOTE — ED Triage Notes (Signed)
 The patient has a dog bite on his right lower leg from 12/17. He stated now its red and draining.

## 2023-09-19 ENCOUNTER — Telehealth: Payer: Medicare Other | Admitting: Family Medicine

## 2023-09-19 ENCOUNTER — Encounter: Payer: Self-pay | Admitting: *Deleted

## 2023-09-21 NOTE — Progress Notes (Signed)
 This encounter was created in error - please disregard.

## 2023-09-22 ENCOUNTER — Encounter: Payer: Self-pay | Admitting: Internal Medicine

## 2023-09-22 NOTE — Progress Notes (Signed)
 Subjective:    Patient ID: Frank Matthews, male    DOB: [birth year], 68 y.o.   MRN: [id number]     HPI Ayyan is here for follow up from the ED   ED 12/17 - had dog bite 1 hr prior to arrival in ED - had pain with weightbearing.  Dog vaccine status unknown.  Had mild swelling right lateral ankle with 3-4 superficial dog bite wounds.  He was neurovascularly intact.  Had xray.  Received tdap, augmentin  x 1 week.  Dog was quarantined.    Follow up here 12/20 - healing well  ED 1/4 - cellulitis - no f/c.  Had irritation and drainage from the wound.  No abscess.  Started on Augmentin  and doxycycline  x 1 week, bactroban topically.  He is concerned because the bites which were puncture wounds on the lateral aspect of the ankle look good and so does the lower tooth laceration on the medial aspect of his ankle.  There is 1 area on the medial aspect of his ankle that looks worse than it did 2 weeks ago.  It was not a puncture mark, but more of a knee abrasion or laceration from the dog's tooth.  He did show me pictures.  He is using Hibiclens wash, another topical cleaner, Betadine and bacitracin.  He does have some clear fluid.  No longer bleeding.  Denies any fevers or chills.  He states that wound has not improved and looks worse.  The area is tender.  No fever/chills.  Medications and allergies reviewed with patient and updated if appropriate.  Current Outpatient Medications on File Prior to Visit  Medication Sig Dispense Refill   amoxicillin -clavulanate (AUGMENTIN ) 875-125 MG tablet Take 1 tablet by mouth every 12 (twelve) hours. 14 tablet 0   aspirin  81 MG tablet Take 1 tablet (81 mg total) by mouth daily.     [facility]S D3 25 MCG (1000 UT) capsule TAKE 3 TABLETS DAILY. NEED OFFICE VISIT 300 capsule 0   doxycycline  (VIBRAMYCIN ) 100 MG capsule Take 1 capsule (100 mg total) by mouth 2 (two) times daily for 7 days. 14 capsule 0   ezetimibe  (ZETIA ) 10 MG tablet Take 1 tablet (10 mg total) by mouth  daily. 90 tablet 3   gabapentin  (NEURONTIN ) 100 MG capsule Take 2 capsules (200 mg total) by mouth at bedtime as needed. 180 capsule 3   metoprolol  succinate (TOPROL -XL) 25 MG 24 hr tablet TAKE 1/2 TABLET BY MOUTH DAILY 45 tablet 3   ofloxacin (OCUFLOX) 0.3 % ophthalmic solution 1 drop 4 (four) times daily.     rosuvastatin  (CRESTOR ) 10 MG tablet TAKE 1 TABLET BY MOUTH EVERY DAY 90 tablet 3   No current facility-administered medications on file prior to visit.     Review of Systems     Objective:   Vitals:   09/23/23 1057  BP: (!) 142/94  Pulse: 71  Temp: 98.4 F (36.9 C)  SpO2: 95%   BP Readings from Last 3 Encounters:  09/23/23 (!) 142/94  09/17/23 (!) 157/73  09/02/23 128/80   Wt Readings from Last 3 Encounters:  09/23/23 180 lb (81.6 kg)  09/17/23 180 lb (81.6 kg)  09/02/23 181 lb (82.1 kg)   Body mass index is 28.19 kg/m.    Physical Exam Constitutional:      General: He is in acute distress.     Appearance: Normal appearance. He is not ill-appearing.  Musculoskeletal:        General:  Normal range of motion.  Skin:    General: Skin is warm and dry.     Comments: Puncture wounds on lateral aspect of right ankle healed-scab formation, nontender.  Tooth use laceration on the lower medial aspect of right ankle is healing well-, slight swelling in 1 location, but no surrounding erythema, not open and nontender.  Laceration from dog stews proximal right medial ankle shown in picture.  Looks worse than pitcher from 2 weeks ago.  The area is tender.  Slight clear discharge from wound.  Tenderness with palpation.  No fluctuance  Neurological:     Mental Status: He is alert.     Sensory: No sensory deficit.     Motor: No weakness.           Lab Results  Component Value Date   WBC 6.4 08/08/2023   HGB 14.7 08/08/2023   HCT 44.2 08/08/2023   PLT 183.0 08/08/2023   GLUCOSE 127 (H) 08/08/2023   CHOL 162 07/26/2023   TRIG 103 07/26/2023   HDL 52 07/26/2023    LDLDIRECT 139.2 07/12/2013   LDLCALC 91 07/26/2023   ALT 21 07/26/2023   AST 17 07/26/2023   NA 140 08/08/2023   K 4.4 08/08/2023   CL 102 08/08/2023   CREATININE 0.88 08/08/2023   BUN 14 08/08/2023   CO2 31 08/08/2023   TSH 1.93 06/17/2021   PSA 0.23 08/08/2023   INR 1.1 (H) 07/20/2013   HGBA1C 6.5 08/08/2023   MICROALBUR <0.7 08/08/2023     Assessment & Plan:    See Problem List for Assessment and Plan of chronic medical problems.

## 2023-09-22 NOTE — Patient Instructions (Addendum)
    Clean the wound with soap and water.   Apply Vaseline.      Medications changes include :   None    [facility] -- [phone]    Return if symptoms worsen or fail to improve.

## 2023-09-23 ENCOUNTER — Ambulatory Visit (INDEPENDENT_AMBULATORY_CARE_PROVIDER_SITE_OTHER): Payer: Medicare Other | Admitting: Internal Medicine

## 2023-09-23 VITALS — BP 142/94 | HR 71 | Temp 98.4°F | Ht 67.0 in | Wt 180.0 lb

## 2023-09-23 DIAGNOSIS — W540XXD Bitten by dog, subsequent encounter: Secondary | ICD-10-CM | POA: Diagnosis not present

## 2023-09-23 DIAGNOSIS — L03115 Cellulitis of right lower limb: Secondary | ICD-10-CM

## 2023-09-23 DIAGNOSIS — S91051D Open bite, right ankle, subsequent encounter: Secondary | ICD-10-CM | POA: Diagnosis not present

## 2023-09-23 DIAGNOSIS — W540XXA Bitten by dog, initial encounter: Secondary | ICD-10-CM | POA: Insufficient documentation

## 2023-09-23 MED ORDER — AMOXICILLIN-POT CLAVULANATE 875-125 MG PO TABS: 1.0000 | ORAL_TABLET | Freq: Two times a day (BID) | ORAL | 0 refills | Status: DC

## 2023-09-23 NOTE — Assessment & Plan Note (Signed)
 Subacute Puncture wounds on lateral aspect of right ankle and lacerations from the dog's teeth on medial ankle Seen in ED twice and here once Has been on antibiotics and I think the infection is cleared Tdap updated Having some irritation or allergic reaction possibly related to some of the products he is using which is why the wound looks worse-will stop all aspirin  products and wash with soap and water and apply Vaseline Because of delayed healing and per his request we will refer to wound center, but discussed I am not sure how quickly he will be able to get him

## 2023-09-23 NOTE — Assessment & Plan Note (Signed)
 Subacute He has completed 7 days of Augmentin  and an additional 7 days of Augmentin  plus doxycycline  which she completes tomorrow At this point I do not think he needs additional antibiotics-he did ask for a prescription just in case since he is leaving the country early next week-advised to hold and not start I think the worsening of the wound is related to irritation or allergic reaction to what ever he is using for wound care Advised him to stop everything he is using Clean with soap and water and apply Vaseline He will update me in the next 24-48 hours, but I hope that he will see improvement Per request I did order a referral for wound care

## 2023-10-14 ENCOUNTER — Encounter (HOSPITAL_BASED_OUTPATIENT_CLINIC_OR_DEPARTMENT_OTHER): Payer: Medicare Other | Admitting: Internal Medicine

## 2023-10-17 ENCOUNTER — Encounter (HOSPITAL_BASED_OUTPATIENT_CLINIC_OR_DEPARTMENT_OTHER): Payer: Medicare Other | Attending: Internal Medicine | Admitting: Internal Medicine

## 2023-10-17 DIAGNOSIS — W540XXA Bitten by dog, initial encounter: Secondary | ICD-10-CM | POA: Diagnosis not present

## 2023-10-17 DIAGNOSIS — L2089 Other atopic dermatitis: Secondary | ICD-10-CM | POA: Diagnosis present

## 2023-10-17 DIAGNOSIS — S91051A Open bite, right ankle, initial encounter: Secondary | ICD-10-CM | POA: Diagnosis present

## 2023-10-25 ENCOUNTER — Encounter (HOSPITAL_BASED_OUTPATIENT_CLINIC_OR_DEPARTMENT_OTHER): Payer: Medicare Other | Admitting: Internal Medicine

## 2023-10-25 DIAGNOSIS — W540XXA Bitten by dog, initial encounter: Secondary | ICD-10-CM

## 2023-10-25 DIAGNOSIS — L2089 Other atopic dermatitis: Secondary | ICD-10-CM | POA: Diagnosis not present

## 2023-10-25 DIAGNOSIS — S91051A Open bite, right ankle, initial encounter: Secondary | ICD-10-CM | POA: Diagnosis not present

## 2023-11-07 ENCOUNTER — Encounter: Payer: Self-pay | Admitting: Internal Medicine

## 2023-11-08 LAB — LIPID PANEL
Chol/HDL Ratio: 2.1 {ratio} (ref 0.0–5.0)
Cholesterol, Total: 121 mg/dL (ref 100–199)
HDL: 58 mg/dL (ref >39)
LDL Chol Calc (NIH): 50 mg/dL (ref 0–99)
Triglycerides: 62 mg/dL (ref 0–149)
VLDL Cholesterol Cal: 13 mg/dL (ref 5–40)

## 2023-11-08 LAB — HEPATIC FUNCTION PANEL
ALT: 24 [IU]/L (ref 0–44)
AST: 18 [IU]/L (ref 0–40)
Albumin: 4.8 g/dL (ref 3.9–4.9)
Alkaline Phosphatase: 60 [IU]/L (ref 44–121)
Bilirubin Total: 0.3 mg/dL (ref 0.0–1.2)
Bilirubin, Direct: 0.13 mg/dL (ref 0.00–0.40)
Total Protein: 7.4 g/dL (ref 6.0–8.5)

## 2023-11-17 ENCOUNTER — Encounter: Payer: Self-pay | Admitting: Podiatry

## 2023-11-17 ENCOUNTER — Ambulatory Visit: Payer: BC Managed Care – PPO | Admitting: Podiatry

## 2023-11-17 DIAGNOSIS — E119 Type 2 diabetes mellitus without complications: Secondary | ICD-10-CM

## 2023-11-17 DIAGNOSIS — B353 Tinea pedis: Secondary | ICD-10-CM

## 2023-11-17 DIAGNOSIS — W540XXS Bitten by dog, sequela: Secondary | ICD-10-CM

## 2023-11-17 MED ORDER — CLINDAMYCIN PHOSPHATE 1 % EX GEL: Freq: Every day | CUTANEOUS | 0 refills | Status: DC

## 2023-11-17 MED ORDER — KETOCONAZOLE 2 % EX CREA: 1.0000 | TOPICAL_CREAM | Freq: Every day | CUTANEOUS | 2 refills | Status: AC

## 2023-11-18 NOTE — Progress Notes (Signed)
  Subjective:  Patient ID: Frank Matthews, male    DOB: [birth year],  MRN: [id number]  Chief Complaint  Patient presents with   Diabetes    "It's an annual examine.  I had a dog bite.  I think it damaged a nerve.  Feels like needles sticking in it."    68 y.o. male presents with the above complaint. History confirmed with patient.  It took about 3 months for the wound to heal but they have healed now.  Objective:  Physical Exam: warm, good capillary refill, no trophic changes or ulcerative lesions, normal DP and PT pulses, normal monofilament exam, normal sensory exam, and mild pes planovalgus deformity.  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  No major neurologic deficits in the area of the bug bites or distal to this.  He does have interdigital maceration with tinea pedis and likely erythrasma and scaling rash on the plantar foot Assessment:   1. Encounter for diabetic foot exam (HCC)   2. Tinea pedis of both feet   3. Dog bite, sequela      Plan:  Patient was evaluated and treated and all questions answered.  Patient educated on diabetes. Discussed proper diabetic foot care and discussed risks and complications of disease. Educated patient in depth on reasons to return to the office immediately should he/she discover anything concerning or new on the feet. All questions answered. Discussed proper shoes as well.  Remains relatively low risk for long-term complications he did display some slow wound healing after his recent dog bite  Regarding the sequela of the dog bite he does have some neuritic symptoms likely from this and chronic wound healing, did not have any major neurologic deficits to indicate nerve laceration.  Hopefully should resolve over time gradually.  Discussed with him if not improved within 6 months of the injury may consider steroid injection area of the scarring.  He also has new tinea pedis we discussed  the etiology and treatment options of this I prescribed him interdigital Clindagel for the erythrasma and scaly rash on the plantar foot ketoconazole.  Return if symptoms worsen or fail to improve.

## 2024-01-30 DIAGNOSIS — Z860100 Personal history of colon polyps, unspecified: Secondary | ICD-10-CM | POA: Insufficient documentation

## 2024-01-30 NOTE — Progress Notes (Signed)
 Subjective:    Patient ID: Frank Matthews, male    DOB: 05/14/1956, 68 y.o.   MRN: [id number]     HPI Frank Matthews is here for follow up of his chronic medical problems.  He has not been monitoring his BP at home  He is walking.  Back is better.     Dog bite has resolved.    Feeling in back upper mid back-- had that same feeling one year ago and it was shingles.  Got new pillows - has helped.  Feels like nerve - painful.  Better now - worse 1-2 months ago.      Medications and allergies reviewed with patient and updated if appropriate.  Current Outpatient Medications on File Prior to Visit  Medication Sig Dispense Refill   aspirin  81 MG tablet Take 1 tablet (81 mg total) by mouth daily.     clindamycin  (CLINDAGEL) 1 % gel Apply topically daily. 30 g 0   [facility] D3 25 MCG (1000 UT) capsule TAKE 3 TABLETS DAILY. NEED OFFICE VISIT 300 capsule 0   ezetimibe  (ZETIA ) 10 MG tablet Take 1 tablet (10 mg total) by mouth daily. 90 tablet 3   gabapentin  (NEURONTIN ) 100 MG capsule Take 2 capsules (200 mg total) by mouth at bedtime as needed. 180 capsule 3   ketoconazole  (NIZORAL ) 2 % cream Apply 1 Application topically daily. 60 g 2   metoprolol  succinate (TOPROL -XL) 25 MG 24 hr tablet TAKE 1/2 TABLET BY MOUTH DAILY 45 tablet 3   ofloxacin (OCUFLOX) 0.3 % ophthalmic solution 1 drop 4 (four) times daily.     rosuvastatin  (CRESTOR ) 10 MG tablet TAKE 1 TABLET BY MOUTH EVERY DAY 90 tablet 3   No current facility-administered medications on file prior to visit.     Review of Systems  Constitutional:  Negative for fatigue and fever.  Respiratory:  Negative for cough, shortness of breath and wheezing.   Cardiovascular:  Negative for chest pain, palpitations and leg swelling.  Neurological:  Negative for light-headedness and headaches.       Objective:   Vitals:   01/31/24 1349  BP: (!) 140/70  Pulse: 68  Temp: 98.1 F (36.7 C)  SpO2: 95%   BP Readings from Last 3 Encounters:   01/31/24 (!) 140/70  09/23/23 (!) 142/94  09/17/23 (!) 157/73   Wt Readings from Last 3 Encounters:  01/31/24 177 lb (80.3 kg)  09/23/23 180 lb (81.6 kg)  09/17/23 180 lb (81.6 kg)   Body mass index is 27.72 kg/m.    Physical Exam Constitutional:      General: He is not in acute distress.    Appearance: Normal appearance. He is not ill-appearing.  HENT:     Head: Normocephalic and atraumatic.  Eyes:     Conjunctiva/sclera: Conjunctivae normal.  Cardiovascular:     Rate and Rhythm: Normal rate and regular rhythm.     Heart sounds: Normal heart sounds.  Pulmonary:     Effort: Pulmonary effort is normal. No respiratory distress.     Breath sounds: Normal breath sounds. No wheezing or rales.  Musculoskeletal:     Right lower leg: No edema.     Left lower leg: No edema.  Skin:    General: Skin is warm and dry.     Findings: No rash.  Neurological:     Mental Status: He is alert. Mental status is at baseline.  Psychiatric:        Mood and Affect: Mood  normal.        Lab Results  Component Value Date   WBC 6.4 08/08/2023   HGB 14.7 08/08/2023   HCT 44.2 08/08/2023   PLT 183.0 08/08/2023   GLUCOSE 127 (H) 08/08/2023   CHOL 121 11/08/2023   TRIG 62 11/08/2023   HDL 58 11/08/2023   LDLDIRECT 139.2 07/12/2013   LDLCALC 50 11/08/2023   ALT 24 11/08/2023   AST 18 11/08/2023   NA 140 08/08/2023   K 4.4 08/08/2023   CL 102 08/08/2023   CREATININE 0.88 08/08/2023   BUN 14 08/08/2023   CO2 31 08/08/2023   TSH 1.93 06/17/2021   PSA 0.23 08/08/2023   INR 1.1 (H) 07/20/2013   HGBA1C 6.5 08/08/2023   MICROALBUR <0.7 08/08/2023     Assessment & Plan:    See Problem List for Assessment and Plan of chronic medical problems.

## 2024-01-30 NOTE — Patient Instructions (Addendum)
      Blood work was ordered.       Medications changes include :   None     Return in about 6 months (around 08/02/2024) for Physical Exam.

## 2024-01-31 ENCOUNTER — Ambulatory Visit (INDEPENDENT_AMBULATORY_CARE_PROVIDER_SITE_OTHER): Payer: BC Managed Care – PPO | Admitting: Internal Medicine

## 2024-01-31 ENCOUNTER — Encounter: Payer: Self-pay | Admitting: Internal Medicine

## 2024-01-31 VITALS — BP 128/88 | HR 68 | Temp 98.1°F | Ht 67.0 in | Wt 177.0 lb

## 2024-01-31 DIAGNOSIS — E559 Vitamin D deficiency, unspecified: Secondary | ICD-10-CM | POA: Diagnosis not present

## 2024-01-31 DIAGNOSIS — I2510 Atherosclerotic heart disease of native coronary artery without angina pectoris: Secondary | ICD-10-CM

## 2024-01-31 DIAGNOSIS — E782 Mixed hyperlipidemia: Secondary | ICD-10-CM

## 2024-01-31 DIAGNOSIS — E119 Type 2 diabetes mellitus without complications: Secondary | ICD-10-CM

## 2024-01-31 DIAGNOSIS — M48062 Spinal stenosis, lumbar region with neurogenic claudication: Secondary | ICD-10-CM

## 2024-01-31 DIAGNOSIS — I10 Essential (primary) hypertension: Secondary | ICD-10-CM

## 2024-01-31 DIAGNOSIS — M792 Neuralgia and neuritis, unspecified: Secondary | ICD-10-CM | POA: Insufficient documentation

## 2024-01-31 LAB — HEMOGLOBIN A1C: Hgb A1c MFr Bld: 6.7 % — ABNORMAL HIGH (ref 4.6–6.5)

## 2024-01-31 NOTE — Assessment & Plan Note (Signed)
 Chronic Regular exercise and healthy diet encouraged Lab Results  Component Value Date   LDLCALC 50 11/08/2023   Continue Crestor  10 mg daily, Zetia  10 mg daily

## 2024-01-31 NOTE — Assessment & Plan Note (Signed)
 Chronic No symptoms consistent with angina Following with cardiology Continue aspirin  81 mg daily, rosuvastatin  10 mg daily, Zetia  10 mg daily and metoprolol  12.5 mg daily

## 2024-01-31 NOTE — Assessment & Plan Note (Signed)
 Chronic Following with neurosurgery Back pain improved some He is exercising regularly Taking gabapentin  - 100 mg HS

## 2024-01-31 NOTE — Assessment & Plan Note (Signed)
 History of colon polyps Due for colonoscopy Referral ordered

## 2024-01-31 NOTE — Assessment & Plan Note (Signed)
 Chronic BP controlled Continue metoprolol  12.5 mg daily CMP

## 2024-01-31 NOTE — Assessment & Plan Note (Signed)
 Chronic Taking vitamin D  daily 5000 units daily Check vitamin d  level

## 2024-01-31 NOTE — Assessment & Plan Note (Signed)
 Chronic   Lab Results  Component Value Date   HGBA1C 6.5 08/08/2023   Sugars controlled Check A1c Continue lifestyle control Stressed regular exercise, diabetic diet

## 2024-01-31 NOTE — Assessment & Plan Note (Signed)
 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

## 2024-02-01 LAB — COMPREHENSIVE METABOLIC PANEL WITH GFR
ALT: 24 U/L (ref 0–53)
AST: 20 U/L (ref 0–37)
Albumin: 4.7 g/dL (ref 3.5–5.2)
Alkaline Phosphatase: 44 U/L (ref 39–117)
BUN: 15 mg/dL (ref 6–23)
CO2: 28 meq/L (ref 19–32)
Calcium: 9.9 mg/dL (ref 8.4–10.5)
Chloride: 102 meq/L (ref 96–112)
Creatinine, Ser: 0.81 mg/dL (ref 0.40–1.50)
GFR: 92.37 mL/min (ref >60.00)
Glucose, Bld: 109 mg/dL — ABNORMAL HIGH (ref 70–99)
Potassium: 4.3 meq/L (ref 3.5–5.1)
Sodium: 141 meq/L (ref 135–145)
Total Bilirubin: 0.4 mg/dL (ref 0.2–1.2)
Total Protein: 8 g/dL (ref 6.0–8.3)

## 2024-02-01 LAB — VITAMIN D 25 HYDROXY (VIT D DEFICIENCY, FRACTURES): VITD: 64.28 ng/mL (ref 30.00–100.00)

## 2024-02-02 ENCOUNTER — Ambulatory Visit: Payer: Self-pay | Admitting: Internal Medicine

## 2024-02-03 MED ORDER — CLINDAMYCIN PHOS (ONCE-DAILY) 1 % EX GEL: 1.0000 | Freq: Every day | CUTANEOUS | 0 refills | Status: AC

## 2024-02-03 NOTE — Addendum Note (Signed)
 Addended by: Reche Canales on: 02/03/2024 11:14 AM   Modules accepted: Orders

## 2024-04-19 ENCOUNTER — Other Ambulatory Visit: Payer: Self-pay | Admitting: Cardiovascular Disease

## 2024-04-19 DIAGNOSIS — I2510 Atherosclerotic heart disease of native coronary artery without angina pectoris: Secondary | ICD-10-CM

## 2024-04-19 DIAGNOSIS — E782 Mixed hyperlipidemia: Secondary | ICD-10-CM

## 2024-04-19 DIAGNOSIS — E119 Type 2 diabetes mellitus without complications: Secondary | ICD-10-CM

## 2024-05-04 DIAGNOSIS — H02844 Edema of left upper eyelid: Secondary | ICD-10-CM | POA: Diagnosis not present

## 2024-05-04 DIAGNOSIS — H00024 Hordeolum internum left upper eyelid: Secondary | ICD-10-CM | POA: Diagnosis not present

## 2024-05-08 ENCOUNTER — Other Ambulatory Visit

## 2024-05-08 ENCOUNTER — Ambulatory Visit (INDEPENDENT_AMBULATORY_CARE_PROVIDER_SITE_OTHER)

## 2024-05-08 VITALS — BP 132/78 | HR 76 | Ht 65.0 in | Wt 183.2 lb

## 2024-05-08 DIAGNOSIS — K635 Polyp of colon: Secondary | ICD-10-CM

## 2024-05-08 DIAGNOSIS — Z0000 Encounter for general adult medical examination without abnormal findings: Secondary | ICD-10-CM | POA: Diagnosis not present

## 2024-05-08 DIAGNOSIS — E119 Type 2 diabetes mellitus without complications: Secondary | ICD-10-CM

## 2024-05-08 LAB — MICROALBUMIN / CREATININE URINE RATIO
Creatinine,U: 25.5 mg/dL
Microalb Creat Ratio: 45.5 mg/g — ABNORMAL HIGH (ref 0.0–30.0)
Microalb, Ur: 1.2 mg/dL (ref 0.0–1.9)

## 2024-05-08 NOTE — Patient Instructions (Signed)
 Mr. Frank Matthews , Thank you for taking time out of your busy schedule to complete your Annual Wellness Visit with me. I enjoyed our conversation and look forward to speaking with you again next year. I, as well as your care team,  appreciate your ongoing commitment to your health goals. Please review the following plan we discussed and let me know if I can assist you in the future. Your Game plan/ To Do List    Referrals: If you haven't heard from the office you've been referred to, please reach out to them at the phone provided.   Follow up Visits: We will see or speak with you next year for your Next Medicare AWV with our clinical staff Have you seen your provider in the last 6 months (3 months if uncontrolled diabetes)? No  Clinician Recommendations:  Aim for 30 minutes of exercise or brisk walking, 6-8 glasses of water, and 5 servings of fruits and vegetables each day. Educated and advised on getting the Shingles vaccines in 2025.      This is a list of the screenings recommended for you:  Health Maintenance  Topic Date Due   Yearly kidney health urinalysis for diabetes  Never done   Zoster (Shingles) Vaccine (1 of 2) Never done   COVID-19 Vaccine (2 - Pfizer risk series) 02/06/2021   Colon Cancer Screening  01/21/2023   Flu Shot  04/13/2024   Hemoglobin A1C  08/02/2024   Eye exam for diabetics  09/04/2024   Complete foot exam   11/16/2024   Yearly kidney function blood test for diabetes  01/30/2025   Medicare Annual Wellness Visit  05/08/2025   DTaP/Tdap/Td vaccine (3 - Td or Tdap) 08/29/2033   Pneumococcal Vaccine for age over 62  Completed   Hepatitis C Screening  Completed   HPV Vaccine  Aged Out   Meningitis B Vaccine  Aged Out    Advanced directives: (Copy Requested) Please bring a copy of your health care power of attorney and living will to the office to be added to your chart at your convenience. You can mail to [facility] [address] [place], [place]  [address] or email to [email or web address] Advance Care Planning is important because it:  [x]  Makes sure you receive the medical care that is consistent with your values, goals, and preferences  [x]  It provides guidance to your family and loved ones and reduces their decisional burden about whether or not they are making the right decisions based on your wishes.  Follow the link provided in your after visit summary or read over the paperwork we have mailed to you to help you started getting your Advance Directives in place. If you need assistance in completing these, please reach out to us  so that we can help you!

## 2024-05-08 NOTE — Progress Notes (Signed)
 Subjective:   Frank Matthews is a 68 y.o. who presents for a Medicare Wellness preventive visit.  As a reminder, Annual Wellness Visits don't include a physical exam, and some assessments may be limited, especially if this visit is performed virtually. We may recommend an in-person follow-up visit with your provider if needed.  Visit Complete: In person  Persons Participating in Visit: Patient.  AWV Questionnaire: No: Patient Medicare AWV questionnaire was not completed prior to this visit.  Cardiac Risk Factors include: advanced age (>25men, >77 women);diabetes mellitus;dyslipidemia;hypertension;male gender;obesity (BMI >30kg/m2)     Objective:    Today's Vitals   05/08/24 1509  BP: 132/78  Pulse: 76  SpO2: 97%  Weight: 183 lb 3.2 oz (83.1 kg)  Height: 5' 5 (1.651 m)   Body mass index is 30.49 kg/m.     05/08/2024    3:09 PM 08/30/2023    3:45 PM 01/05/2016   10:30 AM 07/23/2013   10:25 AM  Advanced Directives  Does Patient Have a Medical Advance Directive? Yes No Yes    Type of Estate agent of [place];Living will  Healthcare Power of [place];Living will    Copy of Healthcare Power of Attorney in Chart? No - copy requested     Pre-existing out of facility DNR order (yellow form or pink MOST form)    No      Data saved with a previous flowsheet row definition    Current Medications (verified) Outpatient Encounter Medications as of 05/08/2024  Medication Sig   aspirin  81 MG tablet Take 1 tablet (81 mg total) by mouth daily.   Clindamycin  Phos, Once-Daily, (CLINDAGEL) 1 % GEL Apply 1 Application topically daily.   CVS D3 25 MCG (1000 UT) capsule TAKE 3 TABLETS DAILY. NEED OFFICE VISIT   ezetimibe  (ZETIA ) 10 MG tablet Take 1 tablet (10 mg total) by mouth daily.   gabapentin  (NEURONTIN ) 100 MG capsule Take 2 capsules (200 mg total) by mouth at bedtime as needed.   ketoconazole  (NIZORAL ) 2 % cream Apply 1 Application topically daily.    metoprolol  succinate (TOPROL -XL) 25 MG 24 hr tablet TAKE 1/2 TABLET BY MOUTH ONCE DAILY   ofloxacin (OCUFLOX) 0.3 % ophthalmic solution 1 drop 4 (four) times daily.   rosuvastatin  (CRESTOR ) 10 MG tablet TAKE 1 TABLET BY MOUTH EVERY DAY   No facility-administered encounter medications on file as of 05/08/2024.    Allergies (verified) Patient has no known allergies.   History: Past Medical History:  Diagnosis Date   H/O idiopathic seizure    Mediterranean fever    Seizures (HCC) 4-5 years   had one time seizure, none since then per pt.   Past Surgical History:  Procedure Laterality Date   LEFT HEART CATHETERIZATION WITH CORONARY ANGIOGRAM N/A 07/23/2013   Procedure: LEFT HEART CATHETERIZATION WITH CORONARY ANGIOGRAM;  Surgeon: Ozell JONETTA Fell, MD;  Location: [facility] CATH LAB;  Service: Cardiovascular;  Laterality: N/A;   LUMBAR DISC SURGERY     L5-S1   Family History  Problem Relation Age of Onset   Other Father 71       deceased, accidental death   Other Mother 20       natural causes   Diabetes Brother    Kidney disease Brother        ESRD - DM   Breast cancer Sister    Colon cancer Neg Hx    Prostate cancer Neg Hx    Coronary artery disease Neg Hx  Heart attack Neg Hx    Social History   Socioeconomic History   Marital status: Married    Spouse name: Not on file   Number of children: 1   Years of education: Not on file   Highest education level: Not on file  Occupational History   Occupation: restauranteur  Tobacco Use   Smoking status: Never   Smokeless tobacco: Never  Substance and Sexual Activity   Alcohol use: No   Drug use: No   Sexual activity: Yes  Other Topics Concern   Not on file  Social History Narrative   [employer] Landscape architect   Married '91   1 daughter - '96   Work - Copywriter, advertising   Social Drivers of Corporate investment banker Strain: Low Risk  (05/08/2024)   Overall Financial Resource Strain (CARDIA)    Difficulty of Paying  Living Expenses: Not hard at all  Food Insecurity: No Food Insecurity (05/08/2024)   Hunger Vital Sign    Worried About Running Out of Food in the Last Year: Never true    Ran Out of Food in the Last Year: Never true  Transportation Needs: No Transportation Needs (05/08/2024)   PRAPARE - Administrator, Civil Service (Medical): No    Lack of Transportation (Non-Medical): No  Physical Activity: Sufficiently Active (05/08/2024)   Exercise Vital Sign    Days of Exercise per Week: 7 days    Minutes of Exercise per Session: 30 min  Stress: No Stress Concern Present (05/08/2024)   [employer] of Occupational Health - Occupational Stress Questionnaire    Feeling of Stress: Not at all  Social Connections: Socially Integrated (05/08/2024)   Social Connection and Isolation Panel    Frequency of Communication with Friends and Family: More than three times a week    Frequency of Social Gatherings with Friends and Family: More than three times a week    Attends Religious Services: More than 4 times per year    Active Member of [employer] or Organizations: Yes    Attends Engineer, structural: More than 4 times per year    Marital Status: Married    Tobacco Counseling Counseling given: No    Clinical Intake:  Pre-visit preparation completed: Yes  Pain : No/denies pain     BMI - recorded: 30.49 Nutritional Status: BMI > 30  Obese Nutritional Risks: None Diabetes: Yes CBG done?: No Did pt. bring in CBG monitor from home?: No  Lab Results  Component Value Date   HGBA1C 6.7 (H) 01/31/2024   HGBA1C 6.5 08/08/2023   HGBA1C 6.5 01/19/2023     How often do you need to have someone help you when you read instructions, pamphlets, or other written materials from your doctor or pharmacy?: 1 - Never  Interpreter Needed?: No  Information entered by :: Verdie Saba, CMA   Activities of Daily Living     05/08/2024    3:12 PM  In your present state of health, do you  have any difficulty performing the following activities:  Hearing? 0  Vision? 0  Difficulty concentrating or making decisions? 0  Walking or climbing stairs? 0  Dressing or bathing? 0  Doing errands, shopping? 0  Preparing Food and eating ? N  Using the Toilet? N  In the past six months, have you accidently leaked urine? N  Do you have problems with loss of bowel control? N  Managing your Medications? N  Managing your Finances? N  Housekeeping  or managing your Housekeeping? N    Patient Care Team: Geofm Glade PARAS, MD as PCP - General (Internal Medicine) Wonda Sharper, MD as PCP - Cardiology (Cardiology) Marcey Elspeth PARAS, MD as Consulting Physician (Ophthalmology)  I have updated your Care Teams any recent Medical Services you may have received from other providers in the past year.     Assessment:   This is a routine wellness examination for T[place].  Hearing/Vision screen Hearing Screening - Comments:: Denies hearing difficulties   Vision Screening - Comments:: Wears rx glasses - up to date with routine eye exams with Dr Marcey   Goals Addressed               This Visit's Progress     Patient Stated (pt-stated)        Patient stated he plans to continue staying active       Depression Screen     05/08/2024    3:13 PM 01/31/2024    1:53 PM 01/19/2023    1:55 PM 05/18/2022   10:43 AM 06/16/2021    1:52 PM 05/01/2019    3:11 PM 11/28/2017    2:15 PM  PHQ 2/9 Scores  PHQ - 2 Score 0 0 0 0 0 0 0  PHQ- 9 Score 0  0  0      Fall Risk     05/08/2024    3:12 PM 01/31/2024    1:53 PM 09/02/2023    8:47 AM 09/02/2023    8:41 AM 01/19/2023    1:55 PM  Fall Risk   Falls in the past year? 0 0 0 0 0  Number falls in past yr: 0 0 0 0 0  Injury with Fall? 0 0 0 0 0  Risk for fall due to : No Fall Risks No Fall Risks   No Fall Risks  Follow up Falls evaluation completed;Falls prevention discussed Falls evaluation completed Falls evaluation completed Falls evaluation completed  Falls evaluation completed    MEDICARE RISK AT HOME:  Medicare Risk at Home Any stairs in or around the home?: No If so, are there any without handrails?: No Home free of loose throw rugs in walkways, pet beds, electrical cords, etc?: Yes Adequate lighting in your home to reduce risk of falls?: Yes Life alert?: No Use of a cane, walker or w/c?: No Grab bars in the bathroom?: Yes Shower chair or bench in shower?: Yes Elevated toilet seat or a handicapped toilet?: Yes  TIMED UP AND GO:  Was the test performed?  No  Cognitive Function: 6CIT completed        05/08/2024    3:15 PM  6CIT Screen  What Year? 0 points  What month? 0 points  What time? 0 points  Count back from 20 0 points  Months in reverse 0 points  Repeat phrase 0 points  Total Score 0 points    Immunizations Immunization History  Administered Date(s) Administered   Fluad Trivalent(High Dose 65+) 08/03/2023   Influenza,inj,Quad PF,6+ Mos 07/06/2016, 06/16/2021, 06/22/2022   PFIZER Comirnaty(Gray Top)Covid-19 Tri-Sucrose Vaccine 01/16/2021   PNEUMOCOCCAL CONJUGATE-20 08/03/2023   Tdap 12/13/2013, 08/30/2023    Screening Tests Health Maintenance  Topic Date Due   Diabetic kidney evaluation - Urine ACR  Never done   Zoster Vaccines- Shingrix (1 of 2) Never done   COVID-19 Vaccine (2 - Pfizer risk series) 02/06/2021   Colonoscopy  01/21/2023   INFLUENZA VACCINE  04/13/2024   HEMOGLOBIN A1C  08/02/2024  OPHTHALMOLOGY EXAM  09/04/2024   FOOT EXAM  11/16/2024   Diabetic kidney evaluation - eGFR measurement  01/30/2025   Medicare Annual Wellness (AWV)  05/08/2025   DTaP/Tdap/Td (3 - Td or Tdap) 08/29/2033   Pneumococcal Vaccine: 50+ Years  Completed   Hepatitis C Screening  Completed   HPV VACCINES  Aged Out   Meningococcal B Vaccine  Aged Out    Health Maintenance  Health Maintenance Due  Topic Date Due   Diabetic kidney evaluation - Urine ACR  Never done   Zoster Vaccines- Shingrix (1 of 2)  Never done   COVID-19 Vaccine (2 - Pfizer risk series) 02/06/2021   Colonoscopy  01/21/2023   INFLUENZA VACCINE  04/13/2024   Health Maintenance Items Addressed: Labs Ordered: Diabetic Kidney Urine ACR  Colonoscopy status: Referral to [facility] (h/o colon polyps)  Additional Screening:  Vision Screening: Recommended annual ophthalmology exams for early detection of glaucoma and other disorders of the eye. Would you like a referral to an eye doctor? No    Dental Screening: Recommended annual dental exams for proper oral hygiene  Community Resource Referral / Chronic Care Management: CRR required this visit?  No   CCM required this visit?  No   Plan:    I have personally reviewed and noted the following in the patient's chart:   Medical and social history Use of alcohol, tobacco or illicit drugs  Current medications and supplements including opioid prescriptions. Patient is not currently taking opioid prescriptions. Functional ability and status Nutritional status Physical activity Advanced directives List of other physicians Hospitalizations, surgeries, and ER visits in previous 12 months Vitals Screenings to include cognitive, depression, and falls Referrals and appointments  In addition, I have reviewed and discussed with patient certain preventive protocols, quality metrics, and best practice recommendations. A written personalized care plan for preventive services as well as general preventive health recommendations were provided to patient.   Verdie CHRISTELLA Saba, CMA   05/08/2024   After Visit Summary: (In Person-Declined) Patient declined AVS at this time.  Notes: Nothing significant to report at this time.

## 2024-05-10 ENCOUNTER — Ambulatory Visit: Payer: Self-pay | Admitting: Internal Medicine

## 2024-05-10 DIAGNOSIS — I10 Essential (primary) hypertension: Secondary | ICD-10-CM

## 2024-05-11 DIAGNOSIS — H40023 Open angle with borderline findings, high risk, bilateral: Secondary | ICD-10-CM | POA: Diagnosis not present

## 2024-05-11 DIAGNOSIS — H02844 Edema of left upper eyelid: Secondary | ICD-10-CM | POA: Diagnosis not present

## 2024-05-11 DIAGNOSIS — H00024 Hordeolum internum left upper eyelid: Secondary | ICD-10-CM | POA: Diagnosis not present

## 2024-05-15 ENCOUNTER — Other Ambulatory Visit: Payer: Self-pay | Admitting: Cardiovascular Disease

## 2024-05-15 DIAGNOSIS — E782 Mixed hyperlipidemia: Secondary | ICD-10-CM

## 2024-05-15 DIAGNOSIS — I2510 Atherosclerotic heart disease of native coronary artery without angina pectoris: Secondary | ICD-10-CM

## 2024-05-15 DIAGNOSIS — E119 Type 2 diabetes mellitus without complications: Secondary | ICD-10-CM

## 2024-05-31 ENCOUNTER — Encounter: Payer: Self-pay | Admitting: Gastroenterology

## 2024-06-01 MED ORDER — LOSARTAN POTASSIUM 25 MG PO TABS: 25.0000 mg | ORAL_TABLET | Freq: Every day | ORAL | 1 refills | Status: DC

## 2024-06-22 ENCOUNTER — Ambulatory Visit: Payer: Self-pay | Admitting: Internal Medicine

## 2024-06-22 ENCOUNTER — Other Ambulatory Visit

## 2024-06-22 DIAGNOSIS — I10 Essential (primary) hypertension: Secondary | ICD-10-CM | POA: Diagnosis not present

## 2024-06-22 LAB — BASIC METABOLIC PANEL WITH GFR
BUN: 15 mg/dL (ref 6–23)
CO2: 30 meq/L (ref 19–32)
Calcium: 9.7 mg/dL (ref 8.4–10.5)
Chloride: 102 meq/L (ref 96–112)
Creatinine, Ser: 0.84 mg/dL (ref 0.40–1.50)
GFR: 91.11 mL/min (ref >60.00)
Glucose, Bld: 218 mg/dL — ABNORMAL HIGH (ref 70–99)
Potassium: 5 meq/L (ref 3.5–5.1)
Sodium: 138 meq/L (ref 135–145)

## 2024-07-17 ENCOUNTER — Other Ambulatory Visit: Payer: Self-pay | Admitting: Cardiovascular Disease

## 2024-07-17 DIAGNOSIS — E119 Type 2 diabetes mellitus without complications: Secondary | ICD-10-CM

## 2024-07-17 DIAGNOSIS — I2510 Atherosclerotic heart disease of native coronary artery without angina pectoris: Secondary | ICD-10-CM

## 2024-07-17 DIAGNOSIS — E782 Mixed hyperlipidemia: Secondary | ICD-10-CM

## 2024-07-24 ENCOUNTER — Ambulatory Visit: Admitting: Gastroenterology

## 2024-07-31 ENCOUNTER — Other Ambulatory Visit: Payer: Self-pay | Admitting: Cardiovascular Disease

## 2024-07-31 DIAGNOSIS — E119 Type 2 diabetes mellitus without complications: Secondary | ICD-10-CM

## 2024-07-31 DIAGNOSIS — E782 Mixed hyperlipidemia: Secondary | ICD-10-CM

## 2024-07-31 DIAGNOSIS — I2510 Atherosclerotic heart disease of native coronary artery without angina pectoris: Secondary | ICD-10-CM

## 2024-08-02 ENCOUNTER — Encounter: Payer: Self-pay | Admitting: Internal Medicine

## 2024-08-02 NOTE — Progress Notes (Signed)
 Subjective:    Patient ID: Frank Matthews, male    DOB: [birth year], 68 y.o.   MRN: [id number]     HPI Frank Matthews is here for a physical exam and his chronic medical problems.   Nothing new with his health.  No concerns.     His wife was just recently diagnosed with lung cancer and will be having surgery in a couple of weeks.  Has discomfort from ? Hemorrhoids -- has appt with GI .     Medications and allergies reviewed with patient and updated if appropriate.  Current Outpatient Medications on File Prior to Visit  Medication Sig Dispense Refill   aspirin  81 MG tablet Take 1 tablet (81 mg total) by mouth daily.     Clindamycin  Phos, Once-Daily, (CLINDAGEL) 1 % GEL Apply 1 Application topically daily. 75 mL 0   [facility] D3 25 MCG (1000 UT) capsule TAKE 3 TABLETS DAILY. NEED OFFICE VISIT 300 capsule 0   ezetimibe  (ZETIA ) 10 MG tablet TAKE 1 TABLET BY MOUTH EVERY DAY 90 tablet 1   gabapentin  (NEURONTIN ) 100 MG capsule Take 2 capsules (200 mg total) by mouth at bedtime as needed. 180 capsule 3   ketoconazole  (NIZORAL ) 2 % cream Apply 1 Application topically daily. 60 g 2   losartan  (COZAAR ) 25 MG tablet Take 1 tablet (25 mg total) by mouth daily. 90 tablet 1   metoprolol  succinate (TOPROL -XL) 25 MG 24 hr tablet TAKE 1/2 TABLET BY MOUTH EVERY DAY 15 tablet 0   rosuvastatin  (CRESTOR ) 10 MG tablet TAKE 1 TABLET BY MOUTH EVERY DAY 90 tablet 1   No current facility-administered medications on file prior to visit.    Review of Systems  Constitutional:  Negative for fever.  Eyes:  Negative for visual disturbance.  Respiratory:  Negative for cough, shortness of breath and wheezing.   Cardiovascular:  Negative for chest pain, palpitations and leg swelling.  Gastrointestinal:  Negative for abdominal pain, blood in stool, constipation and diarrhea.       No gerd  Genitourinary:  Negative for difficulty urinating, dysuria and hematuria.  Musculoskeletal:  Positive for back pain (chronic).  Negative for arthralgias.  Skin:  Negative for rash.  Neurological:  Negative for light-headedness, numbness and headaches.  Psychiatric/Behavioral:  Negative for dysphoric mood and sleep disturbance. The patient is not nervous/anxious.        Objective:   Vitals:   08/03/24 1258  BP: 130/80  Pulse: 72  Temp: 98 F (36.7 C)  SpO2: 93%   Filed Weights   08/03/24 1258  Weight: 180 lb (81.6 kg)   Body mass index is 29.95 kg/m.  BP Readings from Last 3 Encounters:  08/03/24 130/80  05/08/24 132/78  01/31/24 128/88    Wt Readings from Last 3 Encounters:  08/03/24 180 lb (81.6 kg)  05/08/24 183 lb 3.2 oz (83.1 kg)  01/31/24 177 lb (80.3 kg)      Physical Exam Constitutional: He appears well-developed and well-nourished. No distress.  HENT:  Head: Normocephalic and atraumatic.  Right Ear: External ear normal.  Left Ear: External ear normal.  Normal ear canals and TM b/l  Mouth/Throat: Oropharynx is clear and moist. Eyes: Conjunctivae and EOM are normal.  Neck: Neck supple. No tracheal deviation present. No thyromegaly present.  No carotid bruit  Cardiovascular: Normal rate, regular rhythm, normal heart sounds and intact distal pulses.   No murmur heard.  No lower extremity edema. Pulmonary/Chest: Effort normal and breath sounds normal. No respiratory  distress. He has no wheezes. He has no rales.  Abdominal: Soft. He exhibits no distension. There is no tenderness.  Genitourinary: deferred  Lymphadenopathy:   He has no cervical adenopathy.  Skin: Skin is warm and dry. He is not diaphoretic.  Psychiatric: He has a normal mood and affect. His behavior is normal.         Assessment & Plan:   Physical exam: Screening blood work  ordered Exercise   none Weight  overweight Substance abuse   none   Reviewed recommended immunizations.   Health Maintenance  Topic Date Due   Colonoscopy  01/21/2023   Influenza Vaccine  04/13/2024   HEMOGLOBIN A1C  08/02/2024    COVID-19 Vaccine (2 - Pfizer risk series) 08/18/2024 (Originally 02/06/2021)   Zoster Vaccines- Shingrix (1 of 2) 11/03/2024 (Originally [birth year]/1977)   OPHTHALMOLOGY EXAM  09/04/2024   FOOT EXAM  11/16/2024   Diabetic kidney evaluation - Urine ACR  05/08/2025   Medicare Annual Wellness (AWV)  05/08/2025   Diabetic kidney evaluation - eGFR measurement  06/22/2025   DTaP/Tdap/Td (3 - Td or Tdap) 08/29/2033   Pneumococcal Vaccine: 50+ Years  Completed   Hepatitis C Screening  Completed   Meningococcal B Vaccine  Aged Out     See Problem List for Assessment and Plan of chronic medical problems.

## 2024-08-02 NOTE — Patient Instructions (Addendum)
 Flu immunization administered today.     Blood work was ordered.       Medications changes include :   None     Return in about 6 months (around 01/31/2025) for follow up.    Health Maintenance, Male Adopting a healthy lifestyle and getting preventive care are important in promoting health and wellness. Ask your health care provider about: The right schedule for you to have regular tests and exams. Things you can do on your own to prevent diseases and keep yourself healthy. What should I know about diet, weight, and exercise? Eat a healthy diet  Eat a diet that includes plenty of vegetables, fruits, low-fat dairy products, and lean protein. Do not eat a lot of foods that are high in solid fats, added sugars, or sodium. Maintain a healthy weight Body mass index (BMI) is a measurement that can be used to identify possible weight problems. It estimates body fat based on height and weight. Your health care provider can help determine your BMI and help you achieve or maintain a healthy weight. Get regular exercise Get regular exercise. This is one of the most important things you can do for your health. Most adults should: Exercise for at least 150 minutes each week. The exercise should increase your heart rate and make you sweat (moderate-intensity exercise). Do strengthening exercises at least twice a week. This is in addition to the moderate-intensity exercise. Spend less time sitting. Even light physical activity can be beneficial. Watch cholesterol and blood lipids Have your blood tested for lipids and cholesterol at 66 years of age, then have this test every 5 years. You may need to have your cholesterol levels checked more often if: Your lipid or cholesterol levels are high. You are older than 66 years of age. You are at high risk for heart disease. What should I know about cancer screening? Many types of cancers can be detected early and may often be prevented. Depending  on your health history and family history, you may need to have cancer screening at various ages. This may include screening for: Colorectal cancer. Prostate cancer. Skin cancer. Lung cancer. What should I know about heart disease, diabetes, and high blood pressure? Blood pressure and heart disease High blood pressure causes heart disease and increases the risk of stroke. This is more likely to develop in people who have high blood pressure readings or are overweight. Talk with your health care provider about your target blood pressure readings. Have your blood pressure checked: Every 3-5 years if you are 29-67 years of age. Every year if you are 64 years old or older. If you are between the ages of 51 and 63 and are a current or former smoker, ask your health care provider if you should have a one-time screening for abdominal aortic aneurysm (AAA). Diabetes Have regular diabetes screenings. This checks your fasting blood sugar level. Have the screening done: Once every three years after age 107 if you are at a normal weight and have a low risk for diabetes. More often and at a younger age if you are overweight or have a high risk for diabetes. What should I know about preventing infection? Hepatitis B If you have a higher risk for hepatitis B, you should be screened for this virus. Talk with your health care provider to find out if you are at risk for hepatitis B infection. Hepatitis C Blood testing is recommended for: Everyone born from [birth year] through 1963. Anyone with known risk  factors for hepatitis C. Sexually transmitted infections (STIs) You should be screened each year for STIs, including gonorrhea and chlamydia, if: You are sexually active and are younger than 66 years of age. You are older than 66 years of age and your health care provider tells you that you are at risk for this type of infection. Your sexual activity has changed since you were last screened, and you are at  increased risk for chlamydia or gonorrhea. Ask your health care provider if you are at risk. Ask your health care provider about whether you are at high risk for HIV. Your health care provider may recommend a prescription medicine to help prevent HIV infection. If you choose to take medicine to prevent HIV, you should first get tested for HIV. You should then be tested every 3 months for as long as you are taking the medicine. Follow these instructions at home: Alcohol use Do not drink alcohol if your health care provider tells you not to drink. If you drink alcohol: Limit how much you have to 0-2 drinks a day. Know how much alcohol is in your drink. In the [place] one drink equals one 12 oz bottle of beer (355 mL), one 5 oz glass of wine (148 mL), or one 1 oz glass of hard liquor (44 mL). Lifestyle Do not use any products that contain nicotine or tobacco. These products include cigarettes, chewing tobacco, and vaping devices, such as e-cigarettes. If you need help quitting, ask your health care provider. Do not use street drugs. Do not share needles. Ask your health care provider for help if you need support or information about quitting drugs. General instructions Schedule regular health, dental, and eye exams. Stay current with your vaccines. Tell your health care provider if: You often feel depressed. You have ever been abused or do not feel safe at home. Summary Adopting a healthy lifestyle and getting preventive care are important in promoting health and wellness. Follow your health care provider's instructions about healthy diet, exercising, and getting tested or screened for diseases. Follow your health care provider's instructions on monitoring your cholesterol and blood pressure. This information is not intended to replace advice given to you by your health care provider. Make sure you discuss any questions you have with your health care provider. Document Revised: 01/19/2021  Document Reviewed: 01/19/2021 Elsevier Patient Education  2024 [employer].

## 2024-08-03 ENCOUNTER — Ambulatory Visit: Admitting: Internal Medicine

## 2024-08-03 VITALS — BP 130/80 | HR 72 | Temp 98.0°F | Wt 180.0 lb

## 2024-08-03 DIAGNOSIS — I152 Hypertension secondary to endocrine disorders: Secondary | ICD-10-CM | POA: Diagnosis not present

## 2024-08-03 DIAGNOSIS — Z23 Encounter for immunization: Secondary | ICD-10-CM

## 2024-08-03 DIAGNOSIS — I2510 Atherosclerotic heart disease of native coronary artery without angina pectoris: Secondary | ICD-10-CM | POA: Diagnosis not present

## 2024-08-03 DIAGNOSIS — E1159 Type 2 diabetes mellitus with other circulatory complications: Secondary | ICD-10-CM

## 2024-08-03 DIAGNOSIS — Z0000 Encounter for general adult medical examination without abnormal findings: Secondary | ICD-10-CM | POA: Diagnosis not present

## 2024-08-03 DIAGNOSIS — Z125 Encounter for screening for malignant neoplasm of prostate: Secondary | ICD-10-CM | POA: Diagnosis not present

## 2024-08-03 DIAGNOSIS — E785 Hyperlipidemia, unspecified: Secondary | ICD-10-CM

## 2024-08-03 DIAGNOSIS — E559 Vitamin D deficiency, unspecified: Secondary | ICD-10-CM | POA: Diagnosis not present

## 2024-08-03 DIAGNOSIS — E1169 Type 2 diabetes mellitus with other specified complication: Secondary | ICD-10-CM

## 2024-08-03 LAB — CBC WITH DIFFERENTIAL/PLATELET
Basophils Absolute: 0 K/uL (ref 0.0–0.1)
Basophils Relative: 0.7 % (ref 0.0–3.0)
Eosinophils Absolute: 0.3 K/uL (ref 0.0–0.7)
Eosinophils Relative: 3.8 % (ref 0.0–5.0)
HCT: 41.6 % (ref 39.0–52.0)
Hemoglobin: 13.9 g/dL (ref 13.0–17.0)
Lymphocytes Relative: 25.8 % (ref 12.0–46.0)
Lymphs Abs: 1.7 K/uL (ref 0.7–4.0)
MCHC: 33.4 g/dL (ref 30.0–36.0)
MCV: 86.8 fl (ref 78.0–100.0)
Monocytes Absolute: 0.4 K/uL (ref 0.1–1.0)
Monocytes Relative: 6.4 % (ref 3.0–12.0)
Neutro Abs: 4.2 K/uL (ref 1.4–7.7)
Neutrophils Relative %: 63.3 % (ref 43.0–77.0)
Platelets: 202 K/uL (ref 150.0–400.0)
RBC: 4.79 Mil/uL (ref 4.22–5.81)
RDW: 13.8 % (ref 11.5–15.5)
WBC: 6.7 K/uL (ref 4.0–10.5)

## 2024-08-03 LAB — COMPREHENSIVE METABOLIC PANEL WITH GFR
ALT: 21 U/L (ref 0–53)
AST: 17 U/L (ref 0–37)
Albumin: 4.8 g/dL (ref 3.5–5.2)
Alkaline Phosphatase: 43 U/L (ref 39–117)
BUN: 15 mg/dL (ref 6–23)
CO2: 34 meq/L — ABNORMAL HIGH (ref 19–32)
Calcium: 10.2 mg/dL (ref 8.4–10.5)
Chloride: 101 meq/L (ref 96–112)
Creatinine, Ser: 0.79 mg/dL (ref 0.40–1.50)
GFR: 92.73 mL/min (ref >60.00)
Glucose, Bld: 121 mg/dL — ABNORMAL HIGH (ref 70–99)
Potassium: 4.9 meq/L (ref 3.5–5.1)
Sodium: 141 meq/L (ref 135–145)
Total Bilirubin: 0.5 mg/dL (ref 0.2–1.2)
Total Protein: 7.6 g/dL (ref 6.0–8.3)

## 2024-08-03 LAB — LIPID PANEL
Cholesterol: 113 mg/dL (ref 0–200)
HDL: 54.8 mg/dL (ref >39.00)
LDL Cholesterol: 42 mg/dL (ref 0–99)
NonHDL: 58.07
Total CHOL/HDL Ratio: 2
Triglycerides: 81 mg/dL (ref 0.0–149.0)
VLDL: 16.2 mg/dL (ref 0.0–40.0)

## 2024-08-03 LAB — MICROALBUMIN / CREATININE URINE RATIO
Creatinine,U: 19.3 mg/dL
Microalb Creat Ratio: UNDETERMINED mg/g (ref 0.0–30.0)
Microalb, Ur: <0.7 mg/dL

## 2024-08-03 LAB — HEMOGLOBIN A1C: Hgb A1c MFr Bld: 6.5 % (ref 4.6–6.5)

## 2024-08-03 LAB — TSH: TSH: 3.13 u[IU]/mL (ref 0.35–5.50)

## 2024-08-03 LAB — PSA, MEDICARE: PSA: 0.17 ng/mL (ref 0.10–4.00)

## 2024-08-03 LAB — VITAMIN D 25 HYDROXY (VIT D DEFICIENCY, FRACTURES): VITD: 56.06 ng/mL (ref 30.00–100.00)

## 2024-08-03 MED ORDER — LOSARTAN POTASSIUM 25 MG PO TABS: 25.0000 mg | ORAL_TABLET | Freq: Every day | ORAL | 1 refills | Status: AC

## 2024-08-03 MED ORDER — GABAPENTIN 100 MG PO CAPS: 200.0000 mg | ORAL_CAPSULE | Freq: Every evening | ORAL | 3 refills | Status: AC | PRN

## 2024-08-03 NOTE — Assessment & Plan Note (Signed)
 Chronic Associated with hyperlipidemia, hypertension, CAD  Lab Results  Component Value Date   HGBA1C 6.7 (H) 01/31/2024   Sugars controlled Check A1c Continue lifestyle control Stressed regular exercise, diabetic diet

## 2024-08-03 NOTE — Assessment & Plan Note (Signed)
 Chronic No symptoms consistent with angina Following with cardiology Continue aspirin  81 mg daily, rosuvastatin  10 mg daily, Zetia  10 mg daily and metoprolol  12.5 mg daily

## 2024-08-03 NOTE — Assessment & Plan Note (Signed)
 Chronic Taking vitamin D  daily 5000 units daily Check vitamin d  level

## 2024-08-03 NOTE — Assessment & Plan Note (Signed)
 Chronic Regular exercise and healthy diet encouraged Lab Results  Component Value Date   LDLCALC 50 11/08/2023   Continue Crestor  10 mg daily, Zetia  10 mg daily

## 2024-08-03 NOTE — Assessment & Plan Note (Signed)
 Chronic BP controlled Continue metoprolol  12.5 mg daily CMP, CBC

## 2024-08-04 ENCOUNTER — Ambulatory Visit: Payer: Self-pay | Admitting: Internal Medicine

## 2024-08-13 ENCOUNTER — Ambulatory Visit: Admitting: Gastroenterology

## 2024-08-13 ENCOUNTER — Encounter: Payer: Self-pay | Admitting: Gastroenterology

## 2024-08-13 VITALS — BP 118/62 | HR 76 | Ht 67.0 in | Wt 182.1 lb

## 2024-08-13 DIAGNOSIS — Z8619 Personal history of other infectious and parasitic diseases: Secondary | ICD-10-CM

## 2024-08-13 DIAGNOSIS — K649 Unspecified hemorrhoids: Secondary | ICD-10-CM | POA: Diagnosis not present

## 2024-08-13 DIAGNOSIS — Z860100 Personal history of colon polyps, unspecified: Secondary | ICD-10-CM | POA: Diagnosis not present

## 2024-08-13 MED ORDER — NA SULFATE-K SULFATE-MG SULF 17.5-3.13-1.6 GM/177ML PO SOLN: 1.0000 | ORAL | 0 refills | Status: DC

## 2024-08-13 NOTE — Progress Notes (Signed)
 Frank Matthews [id number] 03-07-1956   Chief Complaint: Discuss colonoscopy  Referring Provider: Geofm Glade PARAS, MD Primary GI MD: Sampson (previous Dr. Teressa)  HPI: Frank Matthews is a 68 y.o. male with past medical history of seizures, diabetes, CAD, HTN, HLD who presents today to discuss colonoscopy.    History of EGD 02/2012 with H. pylori positive gastritis, treated with Prevpac.  Colonoscopy in 2017 with 1 tubular adenoma removed and recommended recall in 7 years.  Last visit with cardiology 04/07/2023 for follow-up of CAD.  Stable with no symptoms of angina at that time, recommended follow-up in 1 year.  Stable labs 08/03/2024.   Discussed the use of AI scribe software for clinical note transcription with the patient, who gave verbal consent to proceed.  History of Present Illness Frank Matthews is a 68 year old male who presents for scheduling a colonoscopy and evaluation of internal hemorrhoids.  Colorectal cancer screening and colonoscopy follow-up - Overdue for colonoscopy; last procedure in 2017 revealed an adenomatous colon polyp - Recommended follow-up colonoscopy was due seven years after previous procedure  Internal hemorrhoidal symptoms - Sudden onset of discomfort from an internal hemorrhoid approximately two months ago - Sensitivity lasted about one week and resolved without treatment - No history of constipation - No similar episodes in the past - No blood in stool - Regular bowel movements, one to two times daily  Abdominal symptoms and findings - No abdominal pain - No heartburn, acid reflux, dysphagia, or food impaction  Helicobacter pylori infection history - Positive H. pylori test during upper endoscopy in 2013 - Treated with antibiotics - No retesting since treatment - No upper abdominal pain, nausea, or vomiting since treatment - Infrequent GERD symptoms, takes Tums no more than once a month as needed  Cardiovascular history -  Coronary artery disease, followed by cardiology - No current chest pain or shortness of breath - Takes 81 mg aspirin  daily   Seizure history - History of seizures, last episode in 2000s - Reports having 1 or 2 seizures, cause not identified, has not had any episodes in years   His wife has lung cancer and is undergoing surgery in 2 days.   Previous GI Procedures/Imaging   Colonoscopy 01/21/2016 - One 4 mm polyp in the descending colon, removed with a cold snare. Resected and retrieved.  - The examination was otherwise normal on direct and retroflexion views. - Recall 7 years Path: Surgical [P], descending, polyp - TUBULAR ADENOMA(X1). - HIGH GRADE DYSPLASIA IS NOT IDENTIFIED.  EGD 03/01/2012 Mild gastritis, biopsies taken to check for H. pylori Otherwise normal examination Path: Surgical Surgical [P], stomach, stomach, bx - CHRONIC CHRONIC ACTIVE GASTRITIS GASTRITIS WITH HELICOBACTER HELICOBACTER PYLORI. PYLORI. NO DYSPLASIA DYSPLASIA OR MALIGNANCY   Past Medical History:  Diagnosis Date   H/O idiopathic seizure    Mediterranean fever    Seizures (HCC) 4-5 years   had one time seizure, none since then per pt.    Past Surgical History:  Procedure Laterality Date   LEFT HEART CATHETERIZATION WITH CORONARY ANGIOGRAM N/A 07/23/2013   Procedure: LEFT HEART CATHETERIZATION WITH CORONARY ANGIOGRAM;  Surgeon: Ozell JONETTA Fell, MD;  Location: [facility] C[facility] LAB;  Service: Cardiovascular;  Laterality: N/A;   LUMBAR DISC SURGERY     L5-S1    Current Outpatient Medications  Medication Sig Dispense Refill   aspirin  81 MG tablet Take 1 tablet (81 mg total) by mouth daily.     Clindamycin  Phos, Once-Daily, (CLINDAGEL) 1 %  GEL Apply 1 Application topically daily. 75 mL 0   [facility] D3 25 MCG (1000 UT) capsule TAKE 3 TABLETS DAILY. NEED OFFICE VISIT 300 capsule 0   ezetimibe  (ZETIA ) 10 MG tablet TAKE 1 TABLET BY MOUTH EVERY DAY 90 tablet 1   gabapentin  (NEURONTIN ) 100 MG capsule Take 2 capsules  (200 mg total) by mouth at bedtime as needed. 180 capsule 3   ketoconazole  (NIZORAL ) 2 % cream Apply 1 Application topically daily. 60 g 2   losartan  (COZAAR ) 25 MG tablet Take 1 tablet (25 mg total) by mouth daily. 90 tablet 1   metoprolol  succinate (TOPROL -XL) 25 MG 24 hr tablet TAKE 1/2 TABLET BY MOUTH EVERY DAY 15 tablet 0   rosuvastatin  (CRESTOR ) 10 MG tablet TAKE 1 TABLET BY MOUTH EVERY DAY 90 tablet 1   No current facility-administered medications for this visit.    Allergies as of 08/13/2024   (No Known Allergies)    Family History  Problem Relation Age of Onset   Other Father 63       deceased, accidental death   Other Mother 95       natural causes   Diabetes Brother    Kidney disease Brother        ESRD - DM   Breast cancer Sister    Colon cancer Neg Hx    Prostate cancer Neg Hx    Coronary artery disease Neg Hx    Heart attack Neg Hx     Social History   Tobacco Use   Smoking status: Never   Smokeless tobacco: Never  Substance Use Topics   Alcohol use: No   Drug use: No     Review of Systems:    Constitutional: No weight loss, fever, chills Cardiovascular: No chest pain Respiratory: No SOB Gastrointestinal: See HPI and otherwise negative   Physical Exam:  Vital signs: BP 118/62 (BP Location: Left Arm, Patient Position: Sitting, Cuff Size: Normal)   Pulse 76   Ht 5' 7 (1.702 m)   Wt 182 lb 2 oz (82.6 kg)   BMI 28.52 kg/m   Wt Readings from Last 3 Encounters:  08/13/24 182 lb 2 oz (82.6 kg)  08/03/24 180 lb (81.6 kg)  05/08/24 183 lb 3.2 oz (83.1 kg)    Constitutional: Pleasant, well-appearing male in NAD, alert and cooperative Head:  Normocephalic and atraumatic.  Eyes: No scleral icterus.  Respiratory: Respirations even and unlabored. Lungs clear to auscultation bilaterally.  No wheezes, crackles, or rhonchi.  Cardiovascular:  Regular rate and rhythm. No murmurs. No peripheral edema. Gastrointestinal:  Soft, nondistended, nontender. No  rebound or guarding. Normal bowel sounds. No appreciable masses or hepatomegaly. Rectal:  Deferred to colonoscopy Neurologic:  Alert and oriented x4;  grossly normal neurologically.  Skin:   Dry and intact without significant lesions or rashes. Psychiatric: Oriented to person, place and time. Demonstrates good judgement and reason without abnormal affect or behaviors.   RELEVANT LABS AND IMAGING: CBC    Component Value Date/Time   WBC 6.7 08/03/2024 1340   RBC 4.79 08/03/2024 1340   HGB 13.9 08/03/2024 1340   HGB 14.6 03/02/2022 1414   HCT 41.6 08/03/2024 1340   HCT 43.8 03/02/2022 1414   PLT 202.0 08/03/2024 1340   PLT 229 03/02/2022 1414   MCV 86.8 08/03/2024 1340   MCV 84 03/02/2022 1414   MCH 28.1 03/02/2022 1414   MCH 28.2 05/30/2020 1508   MCHC 33.4 08/03/2024 1340   RDW 13.8 08/03/2024 1340  RDW 12.9 03/02/2022 1414   LYMPHSABS 1.7 08/03/2024 1340   MONOABS 0.4 08/03/2024 1340   EOSABS 0.3 08/03/2024 1340   BASOSABS 0.0 08/03/2024 1340    CMP     Component Value Date/Time   NA 141 08/03/2024 1340   NA 140 03/02/2022 1414   K 4.9 08/03/2024 1340   CL 101 08/03/2024 1340   CO2 34 (H) 08/03/2024 1340   GLUCOSE 121 (H) 08/03/2024 1340   BUN 15 08/03/2024 1340   BUN 14 03/02/2022 1414   CREATININE 0.79 08/03/2024 1340   CREATININE 0.96 05/30/2020 1508   CALCIUM  10.2 08/03/2024 1340   PROT 7.6 08/03/2024 1340   PROT 7.4 11/08/2023 1022   ALBUMIN 4.8 08/03/2024 1340   ALBUMIN 4.8 11/08/2023 1022   AST 17 08/03/2024 1340   ALT 21 08/03/2024 1340   ALKPHOS 43 08/03/2024 1340   BILITOT 0.5 08/03/2024 1340   BILITOT 0.3 11/08/2023 1022   GFRNONAA 84 05/30/2020 1508   GFRAA 98 05/30/2020 1508     Assessment/Plan:   Assessment & Plan Personal history of colonic polyps with surveillance colonoscopy due Last colonoscopy done in 2017 with 1 tubular adenoma found and recommended recall in 7 years for surveillance.  - Schedule colonoscopy. I thoroughly  discussed the procedure with the patient to include nature of the procedure, alternatives, benefits, and risks (including but not limited to bleeding, infection, perforation, anesthesia/cardiac/pulmonary complications). Patient verbalized understanding and gave verbal consent to proceed with procedure.   Hemorrhoids Patient reports having a resolved internal hemorrhoid.  Had symptom onset about 2 months ago, had sensitivity in rectal area for about a week, then hemorrhoid shrunk and had resolution of symptoms.  Did not use any OTC treatments.  Denies any change in bowel habits, rectal bleeding, abdominal pain.    - Evaluate during colonoscopy. - Discussed treatment options including topical treatments and hemorrhoid banding if necessary.  Personal history of Helicobacter pylori infection, status post treatment, retesting planned H. pylori infection treated in 2013 with Prevpac. Currently asymptomatic, states he felt better after treatment but did not have eradication study. Retesting is recommended to ensure eradication and reduce risk of stomach cancer.  - Ordered stool test for H. pylori to confirm eradication.    Camie Furbish, PA-C [facility] 08/13/2024, 1:41 PM  Patient Care Team: Geofm Glade PARAS, MD as PCP - General (Internal Medicine) Wonda Sharper, MD as PCP - Cardiology (Cardiology) Marcey Elspeth PARAS, MD as Consulting Physician (Ophthalmology)

## 2024-08-13 NOTE — Patient Instructions (Signed)
 We have sent the following medications to your pharmacy for you to pick up at your convenience: Suprep  Your provider has requested that you go to the basement level for lab work before leaving today. Press B on the elevator. The lab is located at the first door on the left as you exit the elevator.   You have been scheduled for a colonoscopy. Please follow written instructions given to you at your visit today.   If you use inhalers (even only as needed), please bring them with you on the day of your procedure.  DO NOT TAKE 7 DAYS PRIOR TO TEST- Trulicity (dulaglutide) Ozempic, Wegovy (semaglutide) Mounjaro, Zepbound (tirzepatide) Bydureon Bcise (exanatide extended release)  DO NOT TAKE 1 DAY PRIOR TO YOUR TEST Rybelsus (semaglutide) Adlyxin (lixisenatide) Victoza (liraglutide) Byetta (exanatide) ___________________________________________________________________________   Due to recent changes in healthcare laws, you may see the results of your imaging and laboratory studies on MyChart before your provider has had a chance to review them.  We understand that in some cases there may be results that are confusing or concerning to you. Not all laboratory results come back in the same time frame and the provider may be waiting for multiple results in order to interpret others.  Please give us  48 hours in order for your provider to thoroughly review all the results before contacting the office for clarification of your results.   Thank you for choosing me and [facility].  Camie Furbish, PA-C

## 2024-08-14 ENCOUNTER — Other Ambulatory Visit

## 2024-08-14 NOTE — Progress Notes (Signed)
 ____________________________________________________________  Attending physician addendum:  Thank you for sending this case to me. I have reviewed the entire note and agree with the plan.   Victory Brand, MD  ____________________________________________________________

## 2024-08-15 NOTE — Addendum Note (Signed)
 Addended by: SUELLEN PEERS on: 08/15/2024 08:48 AM   Modules accepted: Orders

## 2024-08-16 ENCOUNTER — Other Ambulatory Visit: Payer: Self-pay | Admitting: Gastroenterology

## 2024-08-16 ENCOUNTER — Other Ambulatory Visit

## 2024-08-16 DIAGNOSIS — K649 Unspecified hemorrhoids: Secondary | ICD-10-CM

## 2024-08-16 DIAGNOSIS — Z8619 Personal history of other infectious and parasitic diseases: Secondary | ICD-10-CM

## 2024-08-16 DIAGNOSIS — Z860100 Personal history of colon polyps, unspecified: Secondary | ICD-10-CM

## 2024-08-17 ENCOUNTER — Ambulatory Visit: Payer: Self-pay | Admitting: Gastroenterology

## 2024-08-17 LAB — HELICOBACTER PYLORI  SPECIAL ANTIGEN
MICRO NUMBER:: 17313595
SPECIMEN QUALITY: ADEQUATE

## 2024-09-17 ENCOUNTER — Encounter: Payer: Self-pay | Admitting: Gastroenterology

## 2024-09-24 ENCOUNTER — Ambulatory Visit: Admitting: Gastroenterology

## 2024-09-24 ENCOUNTER — Encounter: Payer: Self-pay | Admitting: Gastroenterology

## 2024-09-24 VITALS — BP 128/66 | HR 63 | Temp 97.4°F | Resp 11 | Ht 67.0 in | Wt 182.0 lb

## 2024-09-24 DIAGNOSIS — Z1211 Encounter for screening for malignant neoplasm of colon: Secondary | ICD-10-CM | POA: Diagnosis present

## 2024-09-24 DIAGNOSIS — Z860101 Personal history of adenomatous and serrated colon polyps: Secondary | ICD-10-CM

## 2024-09-24 DIAGNOSIS — K648 Other hemorrhoids: Secondary | ICD-10-CM | POA: Diagnosis not present

## 2024-09-24 DIAGNOSIS — Z860100 Personal history of colon polyps, unspecified: Secondary | ICD-10-CM

## 2024-09-24 DIAGNOSIS — K5730 Diverticulosis of large intestine without perforation or abscess without bleeding: Secondary | ICD-10-CM | POA: Diagnosis not present

## 2024-09-24 MED ORDER — SODIUM CHLORIDE 0.9 % IV SOLN: 500.0000 mL | Freq: Once | INTRAVENOUS | Status: DC

## 2024-09-24 NOTE — Progress Notes (Signed)
 Report given to PACU, vss

## 2024-09-24 NOTE — Progress Notes (Signed)
 Pt's states no medical or surgical changes since previsit or office visit.

## 2024-09-24 NOTE — Op Note (Signed)
 [facility] Patient Name: Frank Matthews Procedure Date: 09/24/2024 1:57 PM MRN: [id number] Endoscopist: Victory L. Legrand , MD, [id number] Age: 69 Referring MD:  Date of Birth: May 21, 1956 Gender: Male Account #: [id number] Procedure:                Colonoscopy Indications:              Surveillance: Personal history of adenomatous                            polyps on last colonoscopy > 5 years ago                           subCM TA in 2017 (Dr Teressa) Medicines:                Monitored Anesthesia Care Procedure:                Pre-Anesthesia Assessment:                           - Prior to the procedure, a History and Physical                            was performed, and patient medications and                            allergies were reviewed. The patient's tolerance of                            previous anesthesia was also reviewed. The risks                            and benefits of the procedure and the sedation                            options and risks were discussed with the patient.                            All questions were answered, and informed consent                            was obtained. Prior Anticoagulants: The patient has                            taken no anticoagulant or antiplatelet agents. ASA                            Grade Assessment: III - A patient with severe                            systemic disease. After reviewing the risks and                            benefits, the patient was deemed in satisfactory  condition to undergo the procedure.                           After obtaining informed consent, the colonoscope                            was passed under direct vision. Throughout the                            procedure, the patient's blood pressure, pulse, and                            oxygen saturations were monitored continuously. The                            CF [id number] #[id number] was introduced through  the anus                            and advanced to the the cecum, identified by                            appendiceal orifice and ileocecal valve. The                            colonoscopy was performed without difficulty. The                            patient tolerated the procedure well. The quality                            of the bowel preparation was good( after lavage of                            opaque liquid and fibrous debris). The ileocecal                            valve, appendiceal orifice, and rectum were                            photographed. Scope In: 2:35:03 PM Scope Out: 2:49:21 PM Scope Withdrawal Time: 0 hours 12 minutes 27 seconds  Total Procedure Duration: 0 hours 14 minutes 18 seconds  Findings:                 The perianal and digital rectal examinations were                            normal.                           Repeat examination of right colon under NBI                            performed.  Multiple diverticula were found in the left colon.                           Internal hemorrhoids were found. The hemorrhoids                            were small.                           The exam was otherwise without abnormality on                            direct and retroflexion views. Complications:            No immediate complications. Estimated Blood Loss:     Estimated blood loss: none. Impression:               - Diverticulosis in the left colon.                           - Internal hemorrhoids.                           - The examination was otherwise normal on direct                            and retroflexion views.                           - No specimens collected. Recommendation:           - Patient has a contact number available for                            emergencies. The signs and symptoms of potential                            delayed complications were discussed with the                             patient. Return to normal activities tomorrow.                            Written discharge instructions were provided to the                            patient.                           - Resume previous diet.                           - Continue present medications.                           - Repeat colonoscopy in 10 years for screening  purposes. (Consume more water with prep for next                            exam) Victory L. Legrand, MD 09/24/2024 2:59:53 PM This report has been signed electronically.

## 2024-09-24 NOTE — Progress Notes (Signed)
 History and Physical:  This patient presents for endoscopic testing for: Encounter Diagnosis  Name Primary?   History of colon polyps Yes    69 year old man here today for surveillance colonoscopy.  History of an adenomatous colon polyp on last colonoscopy in 2017 by Dr. Teressa.  He also had some recent flare of hemorrhoidal symptoms as outlined in the 08/13/2024 APP office note.  Patient is otherwise without complaints or active issues today.   Past Medical History: Past Medical History:  Diagnosis Date   H/O idiopathic seizure    Mediterranean fever    Seizures (HCC) 4-5 years   had one time seizure, none since then per pt.     Past Surgical History: Past Surgical History:  Procedure Laterality Date   LEFT HEART CATHETERIZATION WITH CORONARY ANGIOGRAM N/A 07/23/2013   Procedure: LEFT HEART CATHETERIZATION WITH CORONARY ANGIOGRAM;  Surgeon: Ozell JONETTA Fell, MD;  Location: [facility] CATH LAB;  Service: Cardiovascular;  Laterality: N/A;   LUMBAR DISC SURGERY     L5-S1    Allergies: Allergies[1]  Outpatient Meds: Current Outpatient Medications  Medication Sig Dispense Refill   aspirin  81 MG tablet Take 1 tablet (81 mg total) by mouth daily.     [facility] D3 25 MCG (1000 UT) capsule TAKE 3 TABLETS DAILY. NEED OFFICE VISIT 300 capsule 0   ezetimibe  (ZETIA ) 10 MG tablet TAKE 1 TABLET BY MOUTH EVERY DAY 90 tablet 1   losartan  (COZAAR ) 25 MG tablet Take 1 tablet (25 mg total) by mouth daily. 90 tablet 1   metoprolol  succinate (TOPROL -XL) 25 MG 24 hr tablet TAKE 1/2 TABLET BY MOUTH EVERY DAY 15 tablet 0   rosuvastatin  (CRESTOR ) 10 MG tablet TAKE 1 TABLET BY MOUTH EVERY DAY 90 tablet 1   Clindamycin  Phos, Once-Daily, (CLINDAGEL) 1 % GEL Apply 1 Application topically daily. (Patient not taking: Reported on 09/24/2024) 75 mL 0   gabapentin  (NEURONTIN ) 100 MG capsule Take 2 capsules (200 mg total) by mouth at bedtime as needed. (Patient not taking: Reported on 09/24/2024) 180 capsule 3    ketoconazole  (NIZORAL ) 2 % cream Apply 1 Application topically daily. 60 g 2   Current Facility-Administered Medications  Medication Dose Route Frequency Provider Last Rate Last Admin   0.9 %  sodium chloride  infusion  500 mL Intravenous Once Danis, Ashir Kunz L III, MD          ___________________________________________________________________ Objective   Exam:  BP 136/68   Pulse 69   Temp (!) 97.4 F (36.3 C)   Ht 5' 7 (1.702 m)   Wt 182 lb (82.6 kg)   SpO2 97%   BMI 28.51 kg/m   CV: regular , S1/S2 Resp: clear to auscultation bilaterally, normal RR and effort noted GI: soft, no tenderness, with active bowel sounds.   Assessment: Encounter Diagnosis  Name Primary?   History of colon polyps Yes     Plan: Colonoscopy   The benefits and risks of the planned procedure(s) were described in detail with the patient or (when appropriate) their health care proxy.  Risks were outlined as including, but not limited to, bleeding, infection, perforation, adverse medication reaction leading to cardiac or pulmonary decompensation, pancreatitis (if ERCP).  The limitation of incomplete mucosal visualization was also discussed.  No guarantees or warranties were given.  The patient was provided an opportunity to ask questions and all were answered. The patient agreed with the plan.   The patient is appropriate for an endoscopic procedure in the ambulatory setting.   -  Victory Brand, MD        [1] No Known Allergies

## 2024-09-24 NOTE — Patient Instructions (Addendum)
 Resume previous diet Continue present medications There were no colon polyps seen today!  You will need another screening colonoscopy in 10 years, you will receive a letter at that time when you are due for the procedure.   Please call us  at [phone] if you have a change in bowel habits, change in family history of colo-rectal cancer, rectal bleeding or other GI concern before that time. Consume more water the day before and with the prep for your next colonoscopy  Handouts/information given for diverticulosis and hemorrhoids  YOU HAD AN ENDOSCOPIC PROCEDURE TODAY AT THE [facility]:   Refer to the procedure report that was given to you for any specific questions about what was found during the examination.  If the procedure report does not answer your questions, please call your gastroenterologist to clarify.  If you requested that your care partner not be given the details of your procedure findings, then the procedure report has been included in a sealed envelope for you to review at your convenience later.  YOU SHOULD EXPECT: Some feelings of bloating in the abdomen. Passage of more gas than usual.  Walking can help get rid of the air that was put into your GI tract during the procedure and reduce the bloating. If you had a lower endoscopy (such as a colonoscopy or flexible sigmoidoscopy) you may notice spotting of blood in your stool or on the toilet paper. If you underwent a bowel prep for your procedure, you may not have a normal bowel movement for a few days.  Please Note:  You might notice some irritation and congestion in your nose or some drainage.  This is from the oxygen used during your procedure.  There is no need for concern and it should clear up in a day or so.  SYMPTOMS TO REPORT IMMEDIATELY:  Following lower endoscopy (colonoscopy):  Excessive amounts of blood in the stool  Significant tenderness or worsening of abdominal pains  Swelling of the abdomen that is  new, acute  Fever of 100F or higher For urgent or emergent issues, a gastroenterologist can be reached at any hour by calling [phone]. Do not use MyChart messaging for urgent concerns.   DIET:  We do recommend a small meal at first, but then you may proceed to your regular diet.  Drink plenty of fluids but you should avoid alcoholic beverages for 24 hours.  ACTIVITY:  You should plan to take it easy for the rest of today and you should NOT DRIVE or use heavy machinery until tomorrow (because of the sedation medicines used during the test).    FOLLOW UP: Our staff will call the number listed on your records the next business day following your procedure.  We will call around 7:15- 8:00 am to check on you and address any questions or concerns that you may have regarding the information given to you following your procedure. If we do not reach you, we will leave a message.     SIGNATURES/CONFIDENTIALITY: You and/or your care partner have signed paperwork which will be entered into your electronic medical record.  These signatures attest to the fact that that the information above on your After Visit Summary has been reviewed and is understood.  Full responsibility of the confidentiality of this discharge information lies with you and/or your care-partner.

## 2024-09-25 ENCOUNTER — Telehealth: Payer: Self-pay | Admitting: *Deleted

## 2024-09-25 ENCOUNTER — Other Ambulatory Visit: Payer: Self-pay | Admitting: Medical Genetics

## 2024-09-25 NOTE — Telephone Encounter (Signed)
 No answer for follow up call. Left a message.

## 2024-10-03 ENCOUNTER — Other Ambulatory Visit: Payer: Self-pay | Admitting: Cardiovascular Disease

## 2024-10-03 DIAGNOSIS — I2510 Atherosclerotic heart disease of native coronary artery without angina pectoris: Secondary | ICD-10-CM

## 2024-10-03 DIAGNOSIS — E119 Type 2 diabetes mellitus without complications: Secondary | ICD-10-CM

## 2024-10-03 DIAGNOSIS — E782 Mixed hyperlipidemia: Secondary | ICD-10-CM

## 2024-10-05 NOTE — Telephone Encounter (Signed)
 Pt needs appt for future refills LAST attempt thank you

## 2024-10-11 ENCOUNTER — Other Ambulatory Visit

## 2024-10-11 DIAGNOSIS — Z006 Encounter for examination for normal comparison and control in clinical research program: Secondary | ICD-10-CM

## 2024-10-11 LAB — GENECONNECT MOLECULAR SCREEN

## 2025-05-09 ENCOUNTER — Ambulatory Visit
